# Patient Record
Sex: Female | Born: 1950 | Race: White | Hispanic: No | Marital: Married
Health system: Southern US, Community
[De-identification: ages and names within clinical notes are randomized; demographics above are authoritative.]

---

## 2016-05-16 ENCOUNTER — Encounter: Payer: Self-pay | Admitting: Gastroenterology

## 2016-06-14 ENCOUNTER — Ambulatory Visit: Payer: Self-pay | Admitting: Nurse Practitioner

## 2016-10-12 ENCOUNTER — Other Ambulatory Visit (HOSPITAL_COMMUNITY): Payer: Medicare Other

## 2016-10-12 ENCOUNTER — Inpatient Hospital Stay
Admission: AD | Admit: 2016-10-12 | Discharge: 2016-11-20 | Disposition: A | Discharge status: Home / Self Care | Payer: Medicare Other | Source: Ambulatory Visit | Attending: Internal Medicine | Admitting: Internal Medicine

## 2016-10-12 DIAGNOSIS — K7460 Unspecified cirrhosis of liver: Secondary | ICD-10-CM

## 2016-10-12 DIAGNOSIS — Z992 Dependence on renal dialysis: Secondary | ICD-10-CM

## 2016-10-12 DIAGNOSIS — J9690 Respiratory failure, unspecified, unspecified whether with hypoxia or hypercapnia: Secondary | ICD-10-CM

## 2016-10-12 DIAGNOSIS — Z539 Procedure and treatment not carried out, unspecified reason: Secondary | ICD-10-CM

## 2016-10-12 DIAGNOSIS — I770 Arteriovenous fistula, acquired: Secondary | ICD-10-CM

## 2016-10-12 LAB — HEMOGLOBIN A1C
HEMOGLOBIN A1C: 5.1 % (ref 4.8–5.6)
MEAN PLASMA GLUCOSE: 99.67 mg/dL

## 2016-10-12 LAB — RENAL FUNCTION PANEL
ALBUMIN: 3.9 g/dL (ref 3.5–5.0)
ANION GAP: 6 (ref 5–15)
BUN: 43 mg/dL — AB (ref 6–20)
CALCIUM: 9.4 mg/dL (ref 8.9–10.3)
CO2: 42 mmol/L — ABNORMAL HIGH (ref 22–32)
CREATININE: 1.6 mg/dL — AB (ref 0.44–1.00)
Chloride: 90 mmol/L — ABNORMAL LOW (ref 101–111)
GFR calc Af Amer: 37 mL/min — ABNORMAL LOW (ref >60)
GFR calc non Af Amer: 32 mL/min — ABNORMAL LOW (ref >60)
GLUCOSE: 119 mg/dL — AB (ref 65–99)
PHOSPHORUS: 2.8 mg/dL (ref 2.5–4.6)
Potassium: 3.6 mmol/L (ref 3.5–5.1)
SODIUM: 138 mmol/L (ref 135–145)

## 2016-10-12 LAB — BLOOD GAS, ARTERIAL
ACID-BASE EXCESS: 15 mmol/L — AB (ref 0.0–2.0)
Bicarbonate: 41.1 mmol/L — ABNORMAL HIGH (ref 20.0–28.0)
O2 Content: 3 L/min
O2 Saturation: 91.4 %
PATIENT TEMPERATURE: 98.6
PH ART: 7.36 (ref 7.350–7.450)
pCO2 arterial: 74.6 mmHg (ref 32.0–48.0)
pO2, Arterial: 61.4 mmHg — ABNORMAL LOW (ref 83.0–108.0)

## 2016-10-12 LAB — COMPREHENSIVE METABOLIC PANEL
ALBUMIN: 3.9 g/dL (ref 3.5–5.0)
ALT: 18 U/L (ref 14–54)
ANION GAP: 7 (ref 5–15)
AST: 32 U/L (ref 15–41)
Alkaline Phosphatase: 157 U/L — ABNORMAL HIGH (ref 38–126)
BUN: 44 mg/dL — ABNORMAL HIGH (ref 6–20)
CALCIUM: 9.6 mg/dL (ref 8.9–10.3)
CHLORIDE: 89 mmol/L — AB (ref 101–111)
CO2: 41 mmol/L — ABNORMAL HIGH (ref 22–32)
CREATININE: 1.57 mg/dL — AB (ref 0.44–1.00)
GFR calc Af Amer: 38 mL/min — ABNORMAL LOW (ref >60)
GFR calc non Af Amer: 33 mL/min — ABNORMAL LOW (ref >60)
GLUCOSE: 121 mg/dL — AB (ref 65–99)
POTASSIUM: 3.6 mmol/L (ref 3.5–5.1)
SODIUM: 137 mmol/L (ref 135–145)
TOTAL PROTEIN: 6.4 g/dL — AB (ref 6.5–8.1)
Total Bilirubin: 4.3 mg/dL — ABNORMAL HIGH (ref 0.3–1.2)

## 2016-10-12 LAB — MAGNESIUM: Magnesium: 2 mg/dL (ref 1.7–2.4)

## 2016-10-12 LAB — CBC WITH DIFFERENTIAL/PLATELET
BASOS ABS: 0.1 10*3/uL (ref 0.0–0.1)
BASOS PCT: 1 %
EOS ABS: 1 10*3/uL — AB (ref 0.0–0.7)
EOS PCT: 13 %
HCT: 30 % — ABNORMAL LOW (ref 36.0–46.0)
Hemoglobin: 9.1 g/dL — ABNORMAL LOW (ref 12.0–15.0)
Lymphocytes Relative: 18 %
Lymphs Abs: 1.4 10*3/uL (ref 0.7–4.0)
MCH: 30.6 pg (ref 26.0–34.0)
MCHC: 30.3 g/dL (ref 30.0–36.0)
MCV: 101 fL — ABNORMAL HIGH (ref 78.0–100.0)
MONO ABS: 0.7 10*3/uL (ref 0.1–1.0)
MONOS PCT: 9 %
NEUTROS ABS: 4.5 10*3/uL (ref 1.7–7.7)
Neutrophils Relative %: 59 %
PLATELETS: 52 10*3/uL — AB (ref 150–400)
RBC: 2.97 MIL/uL — ABNORMAL LOW (ref 3.87–5.11)
RDW: 16.7 % — AB (ref 11.5–15.5)
WBC: 7.6 10*3/uL (ref 4.0–10.5)

## 2016-10-12 LAB — PHOSPHORUS: Phosphorus: 2.8 mg/dL (ref 2.5–4.6)

## 2016-10-12 LAB — TSH: TSH: 19.349 u[IU]/mL — ABNORMAL HIGH (ref 0.350–4.500)

## 2016-10-13 LAB — VANCOMYCIN, TROUGH: VANCOMYCIN TR: 19 ug/mL (ref 15–20)

## 2016-10-13 LAB — PROTIME-INR
INR: 1.42
Prothrombin Time: 17.2 seconds — ABNORMAL HIGH (ref 11.4–15.2)

## 2016-10-13 LAB — CBC
HEMATOCRIT: 31 % — AB (ref 36.0–46.0)
Hemoglobin: 9.2 g/dL — ABNORMAL LOW (ref 12.0–15.0)
MCH: 30.6 pg (ref 26.0–34.0)
MCHC: 29.7 g/dL — AB (ref 30.0–36.0)
MCV: 103 fL — AB (ref 78.0–100.0)
Platelets: 50 10*3/uL — ABNORMAL LOW (ref 150–400)
RBC: 3.01 MIL/uL — ABNORMAL LOW (ref 3.87–5.11)
RDW: 17.3 % — ABNORMAL HIGH (ref 11.5–15.5)
WBC: 6.9 10*3/uL (ref 4.0–10.5)

## 2016-10-13 LAB — RENAL FUNCTION PANEL
ALBUMIN: 3.5 g/dL (ref 3.5–5.0)
Anion gap: 6 (ref 5–15)
BUN: 46 mg/dL — ABNORMAL HIGH (ref 6–20)
CO2: 42 mmol/L — ABNORMAL HIGH (ref 22–32)
Calcium: 9.4 mg/dL (ref 8.9–10.3)
Chloride: 92 mmol/L — ABNORMAL LOW (ref 101–111)
Creatinine, Ser: 1.68 mg/dL — ABNORMAL HIGH (ref 0.44–1.00)
GFR calc Af Amer: 35 mL/min — ABNORMAL LOW (ref >60)
GFR calc non Af Amer: 30 mL/min — ABNORMAL LOW (ref >60)
Glucose, Bld: 140 mg/dL — ABNORMAL HIGH (ref 65–99)
PHOSPHORUS: 3.6 mg/dL (ref 2.5–4.6)
POTASSIUM: 3.6 mmol/L (ref 3.5–5.1)
Sodium: 140 mmol/L (ref 135–145)

## 2016-10-13 NOTE — Consult Note (Signed)
[facility] CONSULT NOTE    Date: 10/13/2016                  Patient Name:  Jane Boyer  MRN: [id number]  DOB: March 12, 1950  Age / Sex: 66 y.o., female         PCP: Legrand Pitts, PA                 Service Requesting Consult: Hospitalist                 Reason for Consult: Acute renal failure/CKD stage III/refractory volume overload            History of Present Illness: Patient is a 66 y.o. female with a PMHx of Left lower extremity cellulitis, cirrhosis of the liver secondary to fatty liver disease, generalized edema, acute renal failure, chronic kidney disease stage III, super morbid obesity, who was admitted to [facility] on 10/12/2016 for ongoing treatment of lower extremity edema, refractory edema, and acute on chronic kidney disease. Patient reports thatshe was started on ultrafiltration at the outside hospital. I also had a chance to discuss the case with the nephrologist at the outside hospital. He did confirm that patient had ultrafiltration of approximately 30 pounds. She was initially started onLasix drip which did not work. She was then transitioned to a Bumex drip which did not work very well either. She has tolerated ultrafiltration quite well.  Her BUN is currently 43 with a creatinine of 1.6. She also She continues to undergo antibiotic therapy.   Medications: Current medications: Vitamin D 5000 units daily, Zosyn 3.375 g IV every 12 hours, vancomycin 1500 mg IV as per pharmacy, Pepcid 20 mg daily, Protonix 40 mg twice a day, potassium effervescent 20 mEq daily, ferrous sulfate 325 mg twice a day, fluticasone salmeterol 1 inhalation twice a day, floor store 250 mg twice a day, ropinirole 0.25 mg daily, metoprolol 25 mg twice a day, folic acid 1 mg daily, Neurontin 600 mg daily, oxycodone 5 mgevery 6 hours when necessary, Tylenol 650 mg every 6 hours    Allergies: No known drug allergies   Past Medical History: Generalized  edema Respiratory acidosis Anemia of chronic kidney disease Cirrhosis of the liver secondary to fatty liver disease Acute on chronic diastolic heart failure Chronic kidne obesity Restls leg syndrome Obesity hypoventilation syndrome Rheumatoid arthritis   Past Surgical History: dialysis catheteracement  Family History: No family history of end-stage renal disease.  Social History: From [place], [place].  No tobacco, ETOH, or illicit drug use.   Review of Systems: Review of Systems  Constitutional: Positive for weight loss. Negative for chills and fever.  HENT: Negative for ear discharge, hearing loss and nosebleeds.   Eyes: Negative for blurred vision and double vision.  Respiratory: Positive for cough and shortness of breath.   Cardiovascular: Positive for orthopnea and leg swelling. Negative for chest pain and palpitations.  Gastrointestinal: Negative for heartburn, nausea and vomiting.  Genitourinary: Negative for dysuria, frequency and urgency.  Musculoskeletal: Positive for back pain. Negative for myalgias.  Skin: Positive for rash.  Neurological: Positive for weakness. Negative for dizziness and focal weakness.  Endo/Heme/Allergies: Negative for polydipsia. Does not bruise/bleed easily.  Psychiatric/Behavioral: Negative for depression. The patient is nervous/anxious.      Vital Signs: There were no vitals taken for this visit.  Weight trends: There were no vitals filed for this visit.  Physical Exam: General: Morbidly obese female, no acute distress  Head: Normocephalic, atraumatic.  Eyes: Anicteric, EOMI  Nose: Mucous membranes moist, not inflammed, nonerythematous.  Throat: Oropharynx nonerythematous, no exudate appreciated.   Neck: Supple, trachea midline.  Lungs:  Normal respiratory effort. Clear to auscultation BL without crackles or wheezes.  Heart: RRR. S1 and S2 normal without gallop, murmur, or rubs.  Abdomen:  BS normoactive. Soft, Nondistended,  non-tender.  No masses or organomegaly.  Extremities: Bilateral upper and lower extremity edema, cellulitis LLE  Neurologic: A&O X3, Motor strength is 5/5 in the all 4 extremities  Skin: Cellulitis apparent in LLE    Lab results: Basic Metabolic Panel:  Recent Labs Lab 10/12/16 0854  NA 137  138  K 3.6  3.6  CL 89*  90*  CO2 41*  42*  GLUCOSE 121*  119*  BUN 44*  43*  CREATININE 1.57*  1.60*  CALCIUM 9.6  9.4  MG 2.0  PHOS 2.8  2.8    Liver Function Tests:  Recent Labs Lab 10/12/16 0854  AST 32  ALT 18  ALKPHOS 157*  BILITOT 4.3*  PROT 6.4*  ALBUMIN 3.9  3.9   No results for input(s): LIPASE, AMYLASE in the last 168 hours. No results for input(s): AMMONIA in the last 168 hours.  CBC:  Recent Labs Lab 10/12/16 0854  WBC 7.6  NEUTROABS 4.5  HGB 9.1*  HCT 30.0*  MCV 101.0*  PLT 52*    Cardiac Enzymes: No results for input(s): CKTOTAL, CKMB, CKMBINDEX, TROPONINI in the last 168 hours.  BNP: Invalid input(s): POCBNP  CBG: No results for input(s): GLUCAP in the last 168 hours.  Microbiology: No results found for this or any previous visit.  Coagulation Studies:  Recent Labs  10/13/16 0550  LABPROT 17.2*  INR 1.42    Urinalysis: No results for input(s): COLORURINE, LABSPEC, PHURINE, GLUCOSEU, HGBUR, BILIRUBINUR, KETONESUR, PROTEINUR, UROBILINOGEN, NITRITE, LEUKOCYTESUR in the last 72 hours.  Invalid input(s): APPERANCEUR    Imaging: Dg Chest Port 1 View  Result Date: 10/12/2016 CLINICAL DATA:  Respiratory failure EXAM: PORTABLE CHEST 1 VIEW COMPARISON:  None. FINDINGS: There is a PICC on the right with tip at the SVC. Dialysis catheter on the right with tip at the upper cavoatrial junction. Cardiomegaly. Diffuse interstitial opacity which could reflect edema. There is asymmetric hazy opacity and pleural based thickening on the right at the base. No pneumothorax. IMPRESSION: 1. Cardiomegaly and edema/vascular congestion. 2. Patchy  lung opacity and asymmetric right pleural based thickening, pneumonia could be superimposed. Electronically Signed   By: Marnee Spring M.D.   On: 10/12/2016 10:40      Assessment & Plan: Pt is a 66 y.o. female with a PMHx of Left lower extremity cellulitis, cirrhosis of the liver secondary to fatty liver disease, generalized edema, acute renal failure, chronic kidney disease stage III, super morbid obesity, obesity hypoventilation syndrome, rheumatoid  Arthritis, restless leg syndrome who [facility] of generalized edema and lower extremity cellulitis.  1. Acute renal failure/chronic kidney disease stage III. Previously the patient's renal function had deteriorated a bit. However she appto be closer to her baseline now. I discussed the case with the patient's nephrologist at the outside hospital. He was performing sequential ultrafiltration without dialysis which was working well. We ill continue this plan. Continue to monitor renal function.  2. Generalized edema. Patient continues to have significant edema. Approximately 30 pounds of fluid were removed with sequential ultrafiltration. Set the patient up for sequential ultrafiltration today as well as on Monday.  3.  Anemia of chronic kidney disease. HLOBIN CURRENTLY (>!> Hold off on Aranesp for now.  4.Thanks for consultation.

## 2016-10-14 ENCOUNTER — Other Ambulatory Visit (HOSPITAL_COMMUNITY): Payer: Medicare Other

## 2016-10-14 LAB — HEPATITIS B CORE ANTIBODY, TOTAL: HEP B C TOTAL AB: NEGATIVE

## 2016-10-14 LAB — HEPATITIS B SURFACE ANTIBODY, QUANTITATIVE: Hepatitis B-Post: 8.6 m[IU]/mL — ABNORMAL LOW (ref >9.9)

## 2016-10-14 LAB — HEPATITIS B SURFACE ANTIGEN: HEP B S AG: NEGATIVE

## 2016-10-16 LAB — BLOOD GAS, ARTERIAL
ACID-BASE EXCESS: 9 mmol/L — AB (ref 0.0–2.0)
ACID-BASE EXCESS: 9.2 mmol/L — AB (ref 0.0–2.0)
ACID-BASE EXCESS: 8.4 mmol/L — AB (ref 0.0–2.0)
Acid-Base Excess: 6.3 mmol/L — ABNORMAL HIGH (ref 0.0–2.0)
Acid-Base Excess: 9.4 mmol/L — ABNORMAL HIGH (ref 0.0–2.0)
BICARBONATE: 35.3 mmol/L — AB (ref 20.0–28.0)
BICARBONATE: 36.4 mmol/L — AB (ref 20.0–28.0)
BICARBONATE: 36.7 mmol/L — AB (ref 20.0–28.0)
Bicarbonate: 36.6 mmol/L — ABNORMAL HIGH (ref 20.0–28.0)
Bicarbonate: 32.7 mmol/L — ABNORMAL HIGH (ref 20.0–28.0)
DELIVERY SYSTEMS: POSITIVE
DELIVERY SYSTEMS: POSITIVE
Delivery systems: POSITIVE
EXPIRATORY PAP: 8
EXPIRATORY PAP: 6
Expiratory PAP: 8
FIO2: 30
FIO2: 35
FIO2: 30
Inspiratory PAP: 16
Inspiratory PAP: 18
Inspiratory PAP: 22
O2 CONTENT: 1 L/min
O2 CONTENT: 2 L/min
O2 SAT: 92.1 %
O2 SAT: 93 %
O2 SAT: 97.1 %
O2 Saturation: 86.1 %
O2 Saturation: 98.3 %
PATIENT TEMPERATURE: 97.2
PATIENT TEMPERATURE: 97.6
PATIENT TEMPERATURE: 98.6
PATIENT TEMPERATURE: 98.6
PCO2 ART: 80.3 mmHg — AB (ref 32.0–48.0)
PCO2 ART: 81 mmHg — AB (ref 32.0–48.0)
PCO2 ART: 88.8 mmHg — AB (ref 32.0–48.0)
PH ART: 7.233 — AB (ref 7.350–7.450)
PH ART: 7.262 — AB (ref 7.350–7.450)
PO2 ART: 62.7 mmHg — AB (ref 83.0–108.0)
Patient temperature: 97.2
pCO2 arterial: 89.1 mmHg (ref 32.0–48.0)
pCO2 arterial: 70.8 mmHg (ref 32.0–48.0)
pH, Arterial: 7.235 — ABNORMAL LOW (ref 7.350–7.450)
pH, Arterial: 7.273 — ABNORMAL LOW (ref 7.350–7.450)
pH, Arterial: 7.286 — ABNORMAL LOW (ref 7.350–7.450)
pO2, Arterial: 52.7 mmHg — ABNORMAL LOW (ref 83.0–108.0)
pO2, Arterial: 112 mmHg — ABNORMAL HIGH (ref 83.0–108.0)
pO2, Arterial: 67.9 mmHg — ABNORMAL LOW (ref 83.0–108.0)
pO2, Arterial: 91.1 mmHg (ref 83.0–108.0)

## 2016-10-16 LAB — DIC (DISSEMINATED INTRAVASCULAR COAGULATION) PANEL
APTT: 33 s (ref 24–36)
FIBRINOGEN: 232 mg/dL (ref 210–475)
PLATELETS: 37 10*3/uL — AB (ref 150–400)
SMEAR REVIEW: NONE SEEN

## 2016-10-16 LAB — CBC
HCT: 29.8 % — ABNORMAL LOW (ref 36.0–46.0)
HEMATOCRIT: 29.5 % — AB (ref 36.0–46.0)
HEMOGLOBIN: 8.7 g/dL — AB (ref 12.0–15.0)
HEMOGLOBIN: 8.8 g/dL — AB (ref 12.0–15.0)
MCH: 31 pg (ref 26.0–34.0)
MCH: 30.8 pg (ref 26.0–34.0)
MCHC: 29.5 g/dL — AB (ref 30.0–36.0)
MCHC: 29.5 g/dL — AB (ref 30.0–36.0)
MCV: 105 fL — AB (ref 78.0–100.0)
MCV: 104.2 fL — ABNORMAL HIGH (ref 78.0–100.0)
Platelets: 36 10*3/uL — ABNORMAL LOW (ref 150–400)
Platelets: 40 10*3/uL — ABNORMAL LOW (ref 150–400)
RBC: 2.81 MIL/uL — AB (ref 3.87–5.11)
RBC: 2.86 MIL/uL — ABNORMAL LOW (ref 3.87–5.11)
RDW: 18.4 % — ABNORMAL HIGH (ref 11.5–15.5)
RDW: 18.4 % — AB (ref 11.5–15.5)
WBC: 7 10*3/uL (ref 4.0–10.5)
WBC: 6.5 10*3/uL (ref 4.0–10.5)

## 2016-10-16 LAB — RENAL FUNCTION PANEL
ALBUMIN: 3.3 g/dL — AB (ref 3.5–5.0)
ANION GAP: 10 (ref 5–15)
ANION GAP: 12 (ref 5–15)
Albumin: 3.4 g/dL — ABNORMAL LOW (ref 3.5–5.0)
BUN: 65 mg/dL — AB (ref 6–20)
BUN: 68 mg/dL — AB (ref 6–20)
CALCIUM: 9.4 mg/dL (ref 8.9–10.3)
CHLORIDE: 92 mmol/L — AB (ref 101–111)
CO2: 37 mmol/L — ABNORMAL HIGH (ref 22–32)
CO2: 35 mmol/L — AB (ref 22–32)
Calcium: 9.4 mg/dL (ref 8.9–10.3)
Chloride: 92 mmol/L — ABNORMAL LOW (ref 101–111)
Creatinine, Ser: 3.16 mg/dL — ABNORMAL HIGH (ref 0.44–1.00)
Creatinine, Ser: 3.42 mg/dL — ABNORMAL HIGH (ref 0.44–1.00)
GFR calc Af Amer: 16 mL/min — ABNORMAL LOW (ref >60)
GFR calc Af Amer: 15 mL/min — ABNORMAL LOW (ref >60)
GFR calc non Af Amer: 13 mL/min — ABNORMAL LOW (ref >60)
GFR, EST NON AFRICAN AMERICAN: 14 mL/min — AB (ref >60)
GLUCOSE: 73 mg/dL (ref 65–99)
Glucose, Bld: 70 mg/dL (ref 65–99)
PHOSPHORUS: 6 mg/dL — AB (ref 2.5–4.6)
POTASSIUM: 4.7 mmol/L (ref 3.5–5.1)
POTASSIUM: 4.8 mmol/L (ref 3.5–5.1)
Phosphorus: 6 mg/dL — ABNORMAL HIGH (ref 2.5–4.6)
SODIUM: 139 mmol/L (ref 135–145)
Sodium: 139 mmol/L (ref 135–145)

## 2016-10-16 LAB — VANCOMYCIN, TROUGH: Vancomycin Tr: 26 ug/mL (ref 15–20)

## 2016-10-16 LAB — DIC (DISSEMINATED INTRAVASCULAR COAGULATION)PANEL
D-Dimer, Quant: 4.86 ug/mL-FEU — ABNORMAL HIGH (ref 0.00–0.50)
INR: 1.43
Prothrombin Time: 17.3 seconds — ABNORMAL HIGH (ref 11.4–15.2)

## 2016-10-16 LAB — MAGNESIUM: MAGNESIUM: 2.3 mg/dL (ref 1.7–2.4)

## 2016-10-16 NOTE — Progress Notes (Signed)
[facility]  ROUNDING NOTE   Subjective:  Patient appears critically ill at the moment. She is currently on BiPAP. Her ABG shows a pH of 7.2, PCO2 81, PO2 60 7.9. In addition her BUN and creatinine also worsening.  Objective:  Vital signs in last 24 hours:  Temperature 90.7 pulse 1 1 respirations 18 blood pressure 106/68  Physical Exam: General: Critically ill appearing, on bipap  Head: bipap facemask on  Eyes: Anicteric  Neck: Supple, trachea midline  Lungs:  Scattered rhonchi, increased work of breathing  Heart: S1S2 no rubs  Abdomen:  Soft, nontender, bowel sounds present  Extremities: 2+ peripheral edema.  Neurologic: Arousable,will follow simple commands  Skin: No lesions       Basic Metabolic Panel:  Recent Labs Lab 10/12/16 0854 10/13/16 1018 10/16/16 0647  NA 137  138 140 139  K 3.6  3.6 3.6 4.7  CL 89*  90* 92* 92*  CO2 41*  42* 42* 37*  GLUCOSE 121*  119* 140* 70  BUN 44*  43* 46* 65*  CREATININE 1.57*  1.60* 1.68* 3.16*  CALCIUM 9.6  9.4 9.4 9.4  MG 2.0  --  2.3  PHOS 2.8  2.8 3.6 6.0*    Liver Function Tests:  Recent Labs Lab 10/12/16 0854 10/13/16 1018 10/16/16 0647  AST 32  --   --   ALT 18  --   --   ALKPHOS 157*  --   --   BILITOT 4.3*  --   --   PROT 6.4*  --   --   ALBUMIN 3.9  3.9 3.5 3.3*   No results for input(s): LIPASE, AMYLASE in the last 168 hours. No results for input(s): AMMONIA in the last 168 hours.  CBC:  Recent Labs Lab 10/12/16 0854 10/13/16 1018 10/16/16 0647  WBC 7.6 6.9 7.0  NEUTROABS 4.5  --   --   HGB 9.1* 9.2* 8.7*  HCT 30.0* 31.0* 29.5*  MCV 101.0* 103.0* 105.0*  PLT 52* 50* 36*    Cardiac Enzymes: No results for input(s): CKTOTAL, CKMB, CKMBINDEX, TROPONINI in the last 168 hours.  BNP: Invalid input(s): POCBNP  CBG: No results for input(s): GLUCAP in the last 168 hours.  Microbiology: No results found for this or any previous visit.  Coagulation Studies: No  results for input(s): LABPROT, INR in the last 72 hours.  Urinalysis: No results for input(s): COLORURINE, LABSPEC, PHURINE, GLUCOSEU, HGBUR, BILIRUBINUR, KETONESUR, PROTEINUR, UROBILINOGEN, NITRITE, LEUKOCYTESUR in the last 72 hours.  Invalid input(s): APPERANCEUR    Imaging: No results found.   Medications:       Assessment/ Plan:  66 y.o. female with a PMHx of Left lower extremity cellulitis, cirrhosis of the liver secondary to fatty liver disease, generalized edema, acute renal failure, chronic kidney disease stage III, super morbid obesity, obesity hypoventilation syndrome, rheumatoid  Arthritis, restless leg syndrome who [facility] treatment of generalized edema and lower extremity cellulitis.  1. Acute renal failure/chronic kidney disease stage III. Patient was receiving sequential ultrafiltration at outside hospital. - We will function appears to be deteriorating at the moment. Therefore we will switch her from sequential ultrafiltration to dialysis. Ultrafiltration target will be 2.5 to 3 kg as tolerated today.  2. Generalized edema. Previously had 30 pounds of fluid removed with sequential ultrafiltration. -  Blood pressure a bit lower today. Therefore we will set up a little ultrafiltration target today.  3.  Anemia of chronic kidney disease.  Hemoglobin down to 8.7. Continue to monitor.  4. Acute respiratory failure. Patient currently on BiPAP. ABG shows pH of 7.2 with a PCO2 of 81.  Management as per hospitalist and pulmonary/critical care.   LOS: 0 Brytney Somes 10/8/201811:55 AM

## 2016-10-17 LAB — CBC
HEMATOCRIT: 29.3 % — AB (ref 36.0–46.0)
Hemoglobin: 9 g/dL — ABNORMAL LOW (ref 12.0–15.0)
MCH: 31.3 pg (ref 26.0–34.0)
MCHC: 30.7 g/dL (ref 30.0–36.0)
MCV: 101.7 fL — AB (ref 78.0–100.0)
PLATELETS: 36 10*3/uL — AB (ref 150–400)
RBC: 2.88 MIL/uL — AB (ref 3.87–5.11)
RDW: 18.6 % — AB (ref 11.5–15.5)
WBC: 5 10*3/uL (ref 4.0–10.5)

## 2016-10-18 LAB — CBC
HCT: 28.7 % — ABNORMAL LOW (ref 36.0–46.0)
HCT: 29.6 % — ABNORMAL LOW (ref 36.0–46.0)
HEMOGLOBIN: 8.8 g/dL — AB (ref 12.0–15.0)
Hemoglobin: 9 g/dL — ABNORMAL LOW (ref 12.0–15.0)
MCH: 31 pg (ref 26.0–34.0)
MCH: 30.8 pg (ref 26.0–34.0)
MCHC: 30.7 g/dL (ref 30.0–36.0)
MCHC: 30.4 g/dL (ref 30.0–36.0)
MCV: 101.1 fL — ABNORMAL HIGH (ref 78.0–100.0)
MCV: 101.4 fL — AB (ref 78.0–100.0)
PLATELETS: 41 10*3/uL — AB (ref 150–400)
PLATELETS: 49 10*3/uL — AB (ref 150–400)
RBC: 2.84 MIL/uL — ABNORMAL LOW (ref 3.87–5.11)
RBC: 2.92 MIL/uL — ABNORMAL LOW (ref 3.87–5.11)
RDW: 18.9 % — AB (ref 11.5–15.5)
RDW: 19 % — AB (ref 11.5–15.5)
WBC: 6.1 10*3/uL (ref 4.0–10.5)
WBC: 7.8 10*3/uL (ref 4.0–10.5)

## 2016-10-18 LAB — RENAL FUNCTION PANEL
ALBUMIN: 3.4 g/dL — AB (ref 3.5–5.0)
ANION GAP: 14 (ref 5–15)
Albumin: 3.4 g/dL — ABNORMAL LOW (ref 3.5–5.0)
Anion gap: 11 (ref 5–15)
BUN: 57 mg/dL — AB (ref 6–20)
BUN: 34 mg/dL — ABNORMAL HIGH (ref 6–20)
CALCIUM: 9.2 mg/dL (ref 8.9–10.3)
CALCIUM: 8.9 mg/dL (ref 8.9–10.3)
CHLORIDE: 97 mmol/L — AB (ref 101–111)
CO2: 28 mmol/L (ref 22–32)
CO2: 27 mmol/L (ref 22–32)
CREATININE: 3.43 mg/dL — AB (ref 0.44–1.00)
CREATININE: 2.52 mg/dL — AB (ref 0.44–1.00)
Chloride: 94 mmol/L — ABNORMAL LOW (ref 101–111)
GFR calc Af Amer: 15 mL/min — ABNORMAL LOW (ref >60)
GFR calc Af Amer: 22 mL/min — ABNORMAL LOW (ref >60)
GFR calc non Af Amer: 13 mL/min — ABNORMAL LOW (ref >60)
GFR calc non Af Amer: 19 mL/min — ABNORMAL LOW (ref >60)
GLUCOSE: 199 mg/dL — AB (ref 65–99)
GLUCOSE: 150 mg/dL — AB (ref 65–99)
PHOSPHORUS: 4.9 mg/dL — AB (ref 2.5–4.6)
Phosphorus: 3.7 mg/dL (ref 2.5–4.6)
Potassium: 4.4 mmol/L (ref 3.5–5.1)
Potassium: 3.8 mmol/L (ref 3.5–5.1)
SODIUM: 136 mmol/L (ref 135–145)
SODIUM: 135 mmol/L (ref 135–145)

## 2016-10-18 LAB — VANCOMYCIN, TROUGH: Vancomycin Tr: 20 ug/mL (ref 15–20)

## 2016-10-18 LAB — HEPARIN INDUCED PLATELET AB (HIT ANTIBODY): HEPARIN INDUCED PLT AB: 0.41 {OD_unit} — AB (ref 0.000–0.400)

## 2016-10-18 NOTE — Progress Notes (Signed)
[facility]  ROUNDING NOTE   Subjective:  Patient much more awake and alert this a.m. Back on nasal cannula at the moment. She did complete hemodialysis earlier today.  Objective:  Vital signs in last 24 hours:  Temperature 97.0 pulse 70 respirations 20 blood pressure 125/62  Physical Exam: General: No acute distress  Head: [place]/AT hearing intact  Eyes: Anicteric  Neck: Supple, trachea midline  Lungs:  Scattered rhonchi, normal effort  Heart: S1S2 no rubs  Abdomen:  Soft, nontender, bowel sounds present  Extremities: 2+ peripheral edema.  Neurologic: Awake, alert, following commands  Skin: No lesions       Basic Metabolic Panel:  Recent Labs Lab 10/12/16 0854 10/13/16 1018 10/16/16 0647 10/16/16 1238 10/18/16 0500  NA 137  138 140 139 139 136  K 3.6  3.6 3.6 4.7 4.8 4.4  CL 89*  90* 92* 92* 92* 94*  CO2 41*  42* 42* 37* 35* 28  GLUCOSE 121*  119* 140* 70 73 199*  BUN 44*  43* 46* 65* 68* 57*  CREATININE 1.57*  1.60* 1.68* 3.16* 3.42* 3.43*  CALCIUM 9.6  9.4 9.4 9.4 9.4 9.2  MG 2.0  --  2.3  --   --   PHOS 2.8  2.8 3.6 6.0* 6.0* 4.9*    Liver Function Tests:  Recent Labs Lab 10/12/16 0854 10/13/16 1018 10/16/16 0647 10/16/16 1238 10/18/16 0500  AST 32  --   --   --   --   ALT 18  --   --   --   --   ALKPHOS 157*  --   --   --   --   BILITOT 4.3*  --   --   --   --   PROT 6.4*  --   --   --   --   ALBUMIN 3.9  3.9 3.5 3.3* 3.4* 3.4*   No results for input(s): LIPASE, AMYLASE in the last 168 hours. No results for input(s): AMMONIA in the last 168 hours.  CBC:  Recent Labs Lab 10/12/16 0854 10/13/16 1018 10/16/16 0647 10/16/16 1238 10/16/16 1505 10/17/16 0606 10/18/16 0500  WBC 7.6 6.9 7.0 6.5  --  5.0 6.1  NEUTROABS 4.5  --   --   --   --   --   --   HGB 9.1* 9.2* 8.7* 8.8*  --  9.0* 8.8*  HCT 30.0* 31.0* 29.5* 29.8*  --  29.3* 28.7*  MCV 101.0* 103.0* 105.0* 104.2*  --  101.7* 101.1*  PLT 52* 50* 36* 40* 37* 36* 41*     Cardiac Enzymes: No results for input(s): CKTOTAL, CKMB, CKMBINDEX, TROPONINI in the last 168 hours.  BNP: Invalid input(s): POCBNP  CBG: No results for input(s): GLUCAP in the last 168 hours.  Microbiology: No results found for this or any previous visit.  Coagulation Studies:  Recent Labs  10/16/16 1505  LABPROT 17.3*  INR 1.43    Urinalysis: No results for input(s): COLORURINE, LABSPEC, PHURINE, GLUCOSEU, HGBUR, BILIRUBINUR, KETONESUR, PROTEINUR, UROBILINOGEN, NITRITE, LEUKOCYTESUR in the last 72 hours.  Invalid input(s): APPERANCEUR    Imaging: No results found.   Medications:       Assessment/ Plan:  66 y.o. female with a PMHx of Left lower extremity cellulitis, cirrhosis of the liver secondary to fatty liver disease, generalized edema, acute renal failure, chronic kidney disease stage III, super morbid obesity, obesity hypoventilation syndrome, rheumatoid  Arthritis, restless leg syndrome who [facility] treatment of generalized edema and lower extremity cellulitis.  1. Acute renal failure/chronic kidney disease stage III. Patient was receiving sequential ultrafiltration at outside hospital. - patient switched from ultrafiltration to hemodialysis. She appears to be tolerating well. She had a treatment today. We will plan for treatment again on Friday.  2. Generalized edema. Previously had 30 pounds of fluid removed with sequential ultrafiltration. -  We will continue to plan for ultrafiltration target of 3-4 kg per treatment.  3.  Anemia of chronic kidney disease. Hemoglobin currently 8.8. We will continue to monitor.  4. Acute respiratory failure. Mental status has significantly improved. She was on BiPAP at our last visit. She is now on nasal cannula.   LOS: 0 Omarie Parcell 10/10/20183:52 PM

## 2016-10-19 LAB — BASIC METABOLIC PANEL
Anion gap: 9 (ref 5–15)
BUN: 42 mg/dL — ABNORMAL HIGH (ref 6–20)
CO2: 31 mmol/L (ref 22–32)
Calcium: 9.1 mg/dL (ref 8.9–10.3)
Chloride: 96 mmol/L — ABNORMAL LOW (ref 101–111)
Creatinine, Ser: 2.93 mg/dL — ABNORMAL HIGH (ref 0.44–1.00)
GFR calc Af Amer: 18 mL/min — ABNORMAL LOW (ref >60)
GFR calc non Af Amer: 16 mL/min — ABNORMAL LOW (ref >60)
Glucose, Bld: 136 mg/dL — ABNORMAL HIGH (ref 65–99)
Potassium: 3.7 mmol/L (ref 3.5–5.1)
Sodium: 136 mmol/L (ref 135–145)

## 2016-10-19 LAB — MAGNESIUM: Magnesium: 2.1 mg/dL (ref 1.7–2.4)

## 2016-10-20 LAB — VANCOMYCIN, TROUGH: Vancomycin Tr: 21 ug/mL (ref 15–20)

## 2016-10-20 NOTE — Progress Notes (Signed)
  [facility]  ROUNDING NOTE   Subjective:  Patient completed hemodialysis today. Ultrafiltration achieved was 3.5 kg.  Objective:  Vital signs in last 24 hours:  Temperature 97 pulse 99 respirations 20 blood pressure 104/67  Physical Exam: General: No acute distress  Head: [place]/AT hearing intact  Eyes: Anicteric  Neck: Supple, trachea midline  Lungs:  Scattered rhonchi, normal effort  Heart: S1S2 no rubs  Abdomen:  Soft, nontender, bowel sounds present  Extremities: 2+ peripheral edema.  Neurologic: Awake, alert, following commands  Skin: Blood blisters LLE       Basic Metabolic Panel:  Recent Labs Lab 10/16/16 0647 10/16/16 1238 10/18/16 0500 10/18/16 1921 10/19/16 0618  NA 139 139 136 135 136  K 4.7 4.8 4.4 3.8 3.7  CL 92* 92* 94* 97* 96*  CO2 37* 35* GLUCOSE 70 73 199* 150* 136*  BUN 65* 68* 57* 34* 42*  CREATININE 3.16* 3.42* 3.43* 2.52* 2.93*  CALCIUM 9.4 9.4 9.2 8.9 9.1  MG 2.3  --   --   --  2.1  PHOS 6.0* 6.0* 4.9* 3.7  --     Liver Function Tests:  Recent Labs Lab 10/16/16 0647 10/16/16 1238 10/18/16 0500 10/18/16 1921  ALBUMIN 3.3* 3.4* 3.4* 3.4*   No results for input(s): LIPASE, AMYLASE in the last 168 hours. No results for input(s): AMMONIA in the last 168 hours.  CBC:  Recent Labs Lab 10/16/16 0647 10/16/16 1238 10/16/16 1505 10/17/16 0606 10/18/16 0500 10/18/16 1921  WBC 7.0 6.5  --  5.0 6.1 7.8  HGB 8.7* 8.8*  --  9.0* 8.8* 9.0*  HCT 29.5* 29.8*  --  29.3* 28.7* 29.6*  MCV 105.0* 104.2*  --  101.7* 101.1* 101.4*  PLT 36* 40* 37* 36* 41* 49*    Cardiac Enzymes: No results for input(s): CKTOTAL, CKMB, CKMBINDEX, TROPONINI in the last 168 hours.  BNP: Invalid input(s): POCBNP  CBG: No results for input(s): GLUCAP in the last 168 hours.  Microbiology: No results found for this or any previous visit.  Coagulation Studies: No results for input(s): LABPROT, INR in the last 72  hours.  Urinalysis: No results for input(s): COLORURINE, LABSPEC, PHURINE, GLUCOSEU, HGBUR, BILIRUBINUR, KETONESUR, PROTEINUR, UROBILINOGEN, NITRITE, LEUKOCYTESUR in the last 72 hours.  Invalid input(s): APPERANCEUR    Imaging: No results found.   Medications:       Assessment/ Plan:  66 y.o. female with a PMHx of Left lower extremity cellulitis, cirrhosis of the liver secondary to fatty liver disease, generalized edema, acute renal failure, chronic kidney disease stage III, super morbid obesity, obesity hypoventilation syndrome, rheumatoid  Arthritis, restless leg syndrome who [facility] treatment of generalized edema and lower extremity cellulitis.  1. Acute renal failure/chronic kidney disease stage III. Patient was receiving sequential ultrafiltration at outside hospital. - earlier this week we switch the patient from ultrafiltration to hemodialysis. She has tolerated this better. We will plan for dialysis again on Monday.  2. Generalized edema. Previously had 30 pounds of fluid removed with sequential ultrafiltration. -  Ultrafiltration achieved today was 3.5 kg. We will plan for continued ultrafiltration with dialysis on Monday.  3.  Anemia of chronic kidney disease. Hemoglobin up to 9.0. We will continue to monitor CBC.  4. Acute respiratory failure. Much improved with respiratory support and ultrafiltration. No longer requiring BiPAP.   LOS: 0 Ohm Dentler 10/12/20186:33 PM

## 2016-10-21 LAB — CBC
HEMATOCRIT: 31.3 % — AB (ref 36.0–46.0)
Hemoglobin: 9.4 g/dL — ABNORMAL LOW (ref 12.0–15.0)
MCH: 30.5 pg (ref 26.0–34.0)
MCHC: 30 g/dL (ref 30.0–36.0)
MCV: 101.6 fL — ABNORMAL HIGH (ref 78.0–100.0)
Platelets: 53 10*3/uL — ABNORMAL LOW (ref 150–400)
RBC: 3.08 MIL/uL — ABNORMAL LOW (ref 3.87–5.11)
RDW: 18.4 % — ABNORMAL HIGH (ref 11.5–15.5)
WBC: 8.9 10*3/uL (ref 4.0–10.5)

## 2016-10-23 LAB — RENAL FUNCTION PANEL
Albumin: 3.1 g/dL — ABNORMAL LOW (ref 3.5–5.0)
Anion gap: 14 (ref 5–15)
BUN: 73 mg/dL — AB (ref 6–20)
CHLORIDE: 94 mmol/L — AB (ref 101–111)
CO2: 24 mmol/L (ref 22–32)
CREATININE: 4.93 mg/dL — AB (ref 0.44–1.00)
Calcium: 8.9 mg/dL (ref 8.9–10.3)
GFR calc non Af Amer: 8 mL/min — ABNORMAL LOW (ref >60)
GFR, EST AFRICAN AMERICAN: 10 mL/min — AB (ref >60)
Glucose, Bld: 169 mg/dL — ABNORMAL HIGH (ref 65–99)
POTASSIUM: 5.2 mmol/L — AB (ref 3.5–5.1)
Phosphorus: 7.1 mg/dL — ABNORMAL HIGH (ref 2.5–4.6)
Sodium: 132 mmol/L — ABNORMAL LOW (ref 135–145)

## 2016-10-23 LAB — CBC
HCT: 30.8 % — ABNORMAL LOW (ref 36.0–46.0)
Hemoglobin: 9.8 g/dL — ABNORMAL LOW (ref 12.0–15.0)
MCH: 32 pg (ref 26.0–34.0)
MCHC: 31.8 g/dL (ref 30.0–36.0)
MCV: 100.7 fL — AB (ref 78.0–100.0)
PLATELETS: 54 10*3/uL — AB (ref 150–400)
RBC: 3.06 MIL/uL — ABNORMAL LOW (ref 3.87–5.11)
RDW: 18.2 % — AB (ref 11.5–15.5)
WBC: 7.8 10*3/uL (ref 4.0–10.5)

## 2016-10-23 NOTE — Progress Notes (Signed)
[facility]  ROUNDING NOTE   Subjective:  Patient had dialysis performed earlier in the day. Ultrafiltration achieved was 3 kg. Patient appears a bit more confused today.  Objective:  Vital signs in last 24 hours:  Temperature 97.7 pulse 80 respirations 18 blood pressure 127/59  Physical Exam: General: No acute distress  Head: [place]/AT hearing intact  Eyes: Anicteric  Neck: Supple, trachea midline  Lungs:  Scattered rhonchi, normal effort  Heart: S1S2 no rubs  Abdomen:  Soft, nontender, bowel sounds present  Extremities: 2+ peripheral edema, b/l LE cellulitis noted  Neurologic: Awake, alert, following commands, confused  Skin: Blood blisters LLE       Basic Metabolic Panel:  Recent Labs Lab 10/18/16 0500 10/18/16 1921 10/19/16 0618 10/23/16 0534  NA 136 135 136 132*  K 4.4 3.8 3.7 5.2*  CL 94* 97* 96* 94*  CO2 GLUCOSE 199* 150* 136* 169*  BUN 57* 34* 42* 73*  CREATININE 3.43* 2.52* 2.93* 4.93*  CALCIUM 9.2 8.9 9.1 8.9  MG  --   --  2.1  --   PHOS 4.9* 3.7  --  7.1*    Liver Function Tests:  Recent Labs Lab 10/18/16 0500 10/18/16 1921 10/23/16 0534  ALBUMIN 3.4* 3.4* 3.1*   No results for input(s): LIPASE, AMYLASE in the last 168 hours. No results for input(s): AMMONIA in the last 168 hours.  CBC:  Recent Labs Lab 10/17/16 0606 10/18/16 0500 10/18/16 1921 10/21/16 0716 10/23/16 0534  WBC 5.0 6.1 7.8 8.9 7.8  HGB 9.0* 8.8* 9.0* 9.4* 9.8*  HCT 29.3* 28.7* 29.6* 31.3* 30.8*  MCV 101.7* 101.1* 101.4* 101.6* 100.7*  PLT 36* 41* 49* 53* 54*    Cardiac Enzymes: No results for input(s): CKTOTAL, CKMB, CKMBINDEX, TROPONINI in the last 168 hours.  BNP: Invalid input(s): POCBNP  CBG: No results for input(s): GLUCAP in the last 168 hours.  Microbiology: No results found for this or any previous visit.  Coagulation Studies: No results for input(s): LABPROT, INR in the last 72 hours.  Urinalysis: No results for  input(s): COLORURINE, LABSPEC, PHURINE, GLUCOSEU, HGBUR, BILIRUBINUR, KETONESUR, PROTEINUR, UROBILINOGEN, NITRITE, LEUKOCYTESUR in the last 72 hours.  Invalid input(s): APPERANCEUR    Imaging: No results found.   Medications:       Assessment/ Plan:  66 y.o. female with a PMHx of Left lower extremity cellulitis, cirrhosis of the liver secondary to fatty liver disease, generalized edema, acute renal failure, chronic kidney disease stage III, super morbid obesity, obesity hypoventilation syndrome, rheumatoid  Arthritis, restless leg syndrome who [facility] treatment of generalized edema and lower extremity cellulitis.  1. Acute renal failure/chronic kidney disease stage III. Patient was receiving sequential ultrafiltration at outside hospital. - We will maintain the patient on hemodialysis. She underwent hemodialysis today and ultrafiltration achieved was 3 kg. We will plan for ultrafiltration and dialysis again on Wednesday.  2. Generalized edema. Previously had 30 pounds of fluid removed with sequential ultrafiltration. -  Ultrafiltration achieved today was 3 kg. Plan for additional ultrafiltration on Wednesday.  3.  Anemia of chronic kidney disease.  Hemoglobin now up to 9.8. Continue to monitor. Patient also has thrombus cytopenia we will continue to pack catheter with citrate.  4. Acute respiratory failure. Patient seems a bit more confused today. Suspect that her CO2 retention is worse at the moment. This was discussed with nursing. Patient has declined BiPAP at times.  LOS: 0 Jaymes Revels 10/15/20184:02  PM

## 2016-10-25 LAB — RENAL FUNCTION PANEL
ANION GAP: 12 (ref 5–15)
Albumin: 3.1 g/dL — ABNORMAL LOW (ref 3.5–5.0)
BUN: 71 mg/dL — AB (ref 6–20)
CHLORIDE: 96 mmol/L — AB (ref 101–111)
CO2: 22 mmol/L (ref 22–32)
Calcium: 8.7 mg/dL — ABNORMAL LOW (ref 8.9–10.3)
Creatinine, Ser: 4.77 mg/dL — ABNORMAL HIGH (ref 0.44–1.00)
GFR calc Af Amer: 10 mL/min — ABNORMAL LOW (ref >60)
GFR calc non Af Amer: 9 mL/min — ABNORMAL LOW (ref >60)
GLUCOSE: 112 mg/dL — AB (ref 65–99)
POTASSIUM: 5.6 mmol/L — AB (ref 3.5–5.1)
Phosphorus: 6.6 mg/dL — ABNORMAL HIGH (ref 2.5–4.6)
Sodium: 130 mmol/L — ABNORMAL LOW (ref 135–145)

## 2016-10-25 LAB — CBC
HEMATOCRIT: 30.6 % — AB (ref 36.0–46.0)
HEMOGLOBIN: 9.6 g/dL — AB (ref 12.0–15.0)
MCH: 31.6 pg (ref 26.0–34.0)
MCHC: 31.4 g/dL (ref 30.0–36.0)
MCV: 100.7 fL — ABNORMAL HIGH (ref 78.0–100.0)
Platelets: 67 10*3/uL — ABNORMAL LOW (ref 150–400)
RBC: 3.04 MIL/uL — ABNORMAL LOW (ref 3.87–5.11)
RDW: 18.4 % — AB (ref 11.5–15.5)
WBC: 8.4 10*3/uL (ref 4.0–10.5)

## 2016-10-25 NOTE — Progress Notes (Signed)
  [facility]  ROUNDING NOTE   Subjective:  Patient completed hemodialysis today. Ultrafiltration achieved was 3.2 kg.   Objective:  Vital signs in last 24 hours:  Temperature 97.4 pulse 80 respirations 16 blood pressure 118/73  Physical Exam: General: No acute distress  Head: [place]/AT hearing intact  Eyes: Anicteric  Neck: Supple, trachea midline  Lungs:  Scattered rhonchi, normal effort  Heart: S1S2 no rubs  Abdomen:  Soft, nontender, bowel sounds present  Extremities: 2+ peripheral edema, b/l LE cellulitis noted  Neurologic: Awake, alert, following commands, confused  Skin: B/L LE cellulitis       Basic Metabolic Panel:  Recent Labs Lab 10/18/16 1921 10/19/16 0618 10/23/16 0534 10/25/16 0619  NA 135 136 132* 130*  K 3.8 3.7 5.2* 5.6*  CL 97* 96* 94* 96*  CO2 27 31 24 22   GLUCOSE 150* 136* 169* 112*  BUN 34* 42* 73* 71*  CREATININE 2.52* 2.93* 4.93* 4.77*  CALCIUM 8.9 9.1 8.9 8.7*  MG  --  2.1  --   --   PHOS 3.7  --  7.1* 6.6*    Liver Function Tests:  Recent Labs Lab 10/18/16 1921 10/23/16 0534 10/25/16 0619  ALBUMIN 3.4* 3.1* 3.1*   No results for input(s): LIPASE, AMYLASE in the last 168 hours. No results for input(s): AMMONIA in the last 168 hours.  CBC:  Recent Labs Lab 10/18/16 1921 10/21/16 0716 10/23/16 0534 10/25/16 0619  WBC 7.8 8.9 7.8 8.4  HGB 9.0* 9.4* 9.8* 9.6*  HCT 29.6* 31.3* 30.8* 30.6*  MCV 101.4* 101.6* 100.7* 100.7*  PLT 49* 53* 54* 67*    Cardiac Enzymes: No results for input(s): CKTOTAL, CKMB, CKMBINDEX, TROPONINI in the last 168 hours.  BNP: Invalid input(s): POCBNP  CBG: No results for input(s): GLUCAP in the last 168 hours.  Microbiology: No results found for this or any previous visit.  Coagulation Studies: No results for input(s): LABPROT, INR in the last 72 hours.  Urinalysis: No results for input(s): COLORURINE, LABSPEC, PHURINE, GLUCOSEU, HGBUR, BILIRUBINUR, KETONESUR, PROTEINUR,  UROBILINOGEN, NITRITE, LEUKOCYTESUR in the last 72 hours.  Invalid input(s): APPERANCEUR    Imaging: No results found.   Medications:       Assessment/ Plan:  66 y.o. female with a PMHx of Left lower extremity cellulitis, cirrhosis of the liver secondary to fatty liver disease, generalized edema, acute renal failure, chronic kidney disease stage III, super morbid obesity, obesity hypoventilation syndrome, rheumatoid  Arthritis, restless leg syndrome who was admitted to select specialty for ongoing treatment of generalized edema and lower extremity cellulitis.  1. Acute renal failure/chronic kidney disease stage III. Patient was receiving sequential ultrafiltration at outside hospital. - Despite hemodialysis on Monday BUN and creatinine still remained high and potassium is also still slightly high. If this trend continues to remain need to replace her PermCath. Reevaluate BMP tomorrow.  2. Generalized edema. Previously had 30 pounds of fluid removed with sequential ultrafiltration. -  Ultrafiltration achieved was 3.2 kg today. We will continue ultrafiltration with dialysis.  3.  Anemia of chronic kidney disease.  Hemoglobin relatively stable at 9.6. Continue to monitor CBC. Hold off on Epogen.  4. Acute respiratory failure. Patient has had times where she declined BiPAP. Encouraged patient to wear BiPAP is recommended.  5. Hyperkalemia. Serum potassium was 5.6 today. We will need to remeasure this tomorrow. Discussed with hospitalist.   LOS: 0 Erik Nessel 10/17/20183:36 PM

## 2016-10-26 LAB — MAGNESIUM: Magnesium: 2.1 mg/dL (ref 1.7–2.4)

## 2016-10-26 LAB — RENAL FUNCTION PANEL
Albumin: 2.9 g/dL — ABNORMAL LOW (ref 3.5–5.0)
Anion gap: 8 (ref 5–15)
BUN: 54 mg/dL — ABNORMAL HIGH (ref 6–20)
CALCIUM: 8.3 mg/dL — AB (ref 8.9–10.3)
CO2: 27 mmol/L (ref 22–32)
Chloride: 95 mmol/L — ABNORMAL LOW (ref 101–111)
Creatinine, Ser: 4 mg/dL — ABNORMAL HIGH (ref 0.44–1.00)
GFR calc non Af Amer: 11 mL/min — ABNORMAL LOW (ref >60)
GFR, EST AFRICAN AMERICAN: 12 mL/min — AB (ref >60)
Glucose, Bld: 126 mg/dL — ABNORMAL HIGH (ref 65–99)
Phosphorus: 5.4 mg/dL — ABNORMAL HIGH (ref 2.5–4.6)
Potassium: 4 mmol/L (ref 3.5–5.1)
SODIUM: 130 mmol/L — AB (ref 135–145)

## 2016-10-27 LAB — RENAL FUNCTION PANEL
Albumin: 2.9 g/dL — ABNORMAL LOW (ref 3.5–5.0)
Anion gap: 11 (ref 5–15)
BUN: 68 mg/dL — AB (ref 6–20)
CHLORIDE: 97 mmol/L — AB (ref 101–111)
CO2: 22 mmol/L (ref 22–32)
Calcium: 8.4 mg/dL — ABNORMAL LOW (ref 8.9–10.3)
Creatinine, Ser: 4.78 mg/dL — ABNORMAL HIGH (ref 0.44–1.00)
GFR calc Af Amer: 10 mL/min — ABNORMAL LOW (ref >60)
GFR calc non Af Amer: 9 mL/min — ABNORMAL LOW (ref >60)
GLUCOSE: 80 mg/dL (ref 65–99)
POTASSIUM: 5 mmol/L (ref 3.5–5.1)
Phosphorus: 6.3 mg/dL — ABNORMAL HIGH (ref 2.5–4.6)
Sodium: 130 mmol/L — ABNORMAL LOW (ref 135–145)

## 2016-10-27 LAB — CBC
HCT: 28.7 % — ABNORMAL LOW (ref 36.0–46.0)
HEMOGLOBIN: 9.3 g/dL — AB (ref 12.0–15.0)
MCH: 32 pg (ref 26.0–34.0)
MCHC: 32.4 g/dL (ref 30.0–36.0)
MCV: 98.6 fL (ref 78.0–100.0)
Platelets: 11 10*3/uL — CL (ref 150–400)
RBC: 2.91 MIL/uL — AB (ref 3.87–5.11)
RDW: 17.9 % — AB (ref 11.5–15.5)
WBC: 6.2 10*3/uL (ref 4.0–10.5)

## 2016-10-27 LAB — TYPE AND SCREEN
ABO/RH(D): O POS
Antibody Screen: NEGATIVE

## 2016-10-27 LAB — ABO/RH: ABO/RH(D): O POS

## 2016-10-27 NOTE — Progress Notes (Signed)
  [facility]  ROUNDING NOTE   Subjective:  Patient due for hemodialysis again today. Still has lower extremity edema with cellulitis.   Objective:  Vital signs in last 24 hours:  Temperature 97.0 pulse 70 respirations 20 blood pressure 122/61  Physical Exam: General: No acute distress  Head: [place]/AT hearing intact  Eyes: Anicteric  Neck: Supple, trachea midline  Lungs:  Scattered rhonchi, normal effort  Heart: S1S2 no rubs  Abdomen:  Soft, nontender, bowel sounds present  Extremities: 2+ peripheral edema, b/l LE cellulitis noted  Neurologic: Awake, alert, following commands, confused  Skin: B/L LE cellulitis  Access:  Right internal jugular PermCath     Basic Metabolic Panel:  Recent Labs Lab 10/23/16 0534 10/25/16 0619 10/26/16 0658  NA 132* 130* 130*  K 5.2* 5.6* 4.0  CL 94* 96* 95*  CO2 24 22 27   GLUCOSE 169* 112* 126*  BUN 73* 71* 54*  CREATININE 4.93* 4.77* 4.00*  CALCIUM 8.9 8.7* 8.3*  MG  --   --  2.1  PHOS 7.1* 6.6* 5.4*    Liver Function Tests:  Recent Labs Lab 10/23/16 0534 10/25/16 0619 10/26/16 0658  ALBUMIN 3.1* 3.1* 2.9*   No results for input(s): LIPASE, AMYLASE in the last 168 hours. No results for input(s): AMMONIA in the last 168 hours.  CBC:  Recent Labs Lab 10/21/16 0716 10/23/16 0534 10/25/16 0619  WBC 8.9 7.8 8.4  HGB 9.4* 9.8* 9.6*  HCT 31.3* 30.8* 30.6*  MCV 101.6* 100.7* 100.7*  PLT 53* 54* 67*    Cardiac Enzymes: No results for input(s): CKTOTAL, CKMB, CKMBINDEX, TROPONINI in the last 168 hours.  BNP: Invalid input(s): POCBNP  CBG: No results for input(s): GLUCAP in the last 168 hours.  Microbiology: No results found for this or any previous visit.  Coagulation Studies: No results for input(s): LABPROT, INR in the last 72 hours.  Urinalysis: No results for input(s): COLORURINE, LABSPEC, PHURINE, GLUCOSEU, HGBUR, BILIRUBINUR, KETONESUR, PROTEINUR, UROBILINOGEN, NITRITE, LEUKOCYTESUR in the last  72 hours.  Invalid input(s): APPERANCEUR    Imaging: No results found.   Medications:       Assessment/ Plan:  66 y.o. female with a PMHx of Left lower extremity cellulitis, cirrhosis of the liver secondary to fatty liver disease, generalized edema, acute renal failure, chronic kidney disease stage III, super morbid obesity, obesity hypoventilation syndrome, rheumatoid  Arthritis, restless leg syndrome who was admitted to select specialty for ongoing treatment of generalized edema and lower extremity cellulitis.  1. Acute renal failure/chronic kidney disease stage III. Patient was receiving sequential ultrafiltration at outside hospital. - Patient due for hemodialysis today. Orders have been prepared.  2. Generalized edema. Previously had 30 pounds of fluid removed with sequential ultrafiltration. -  We will continue ultrafiltration target of 3-3.5 kg with dialysis.  3.  Anemia of chronic kidney disease.  Hemoglobin 9.6 at last check. Continue peritoneal monitor CBC. We will also continue to pack the patient's catheter with citrate.  4. Acute respiratory failure. Patient is to wear BiPAP usually at night. There've been periods of nonadherence.  5. Hyperkalemia. Potassium down to 4.0 and now acceptable. Continue to periodically monitor serum potassium.   LOS: 0 Avonne Berkery 10/19/20188:22 AM

## 2016-10-28 LAB — CBC
HEMATOCRIT: 28.3 % — AB (ref 36.0–46.0)
HEMOGLOBIN: 9 g/dL — AB (ref 12.0–15.0)
MCH: 31.3 pg (ref 26.0–34.0)
MCHC: 31.8 g/dL (ref 30.0–36.0)
MCV: 98.3 fL (ref 78.0–100.0)
Platelets: 50 10*3/uL — ABNORMAL LOW (ref 150–400)
RBC: 2.88 MIL/uL — ABNORMAL LOW (ref 3.87–5.11)
RDW: 18.2 % — AB (ref 11.5–15.5)
WBC: 7.1 10*3/uL (ref 4.0–10.5)

## 2016-10-28 LAB — PREPARE PLATELET PHERESIS: Unit division: 0

## 2016-10-28 LAB — BPAM PLATELET PHERESIS
Blood Product Expiration Date: 201810202359
ISSUE DATE / TIME: 201810191312
Unit Type and Rh: 6200

## 2016-10-30 LAB — CBC
HCT: 28.7 % — ABNORMAL LOW (ref 36.0–46.0)
HEMOGLOBIN: 9.3 g/dL — AB (ref 12.0–15.0)
MCH: 31.3 pg (ref 26.0–34.0)
MCHC: 32.4 g/dL (ref 30.0–36.0)
MCV: 96.6 fL (ref 78.0–100.0)
Platelets: 45 10*3/uL — ABNORMAL LOW (ref 150–400)
RBC: 2.97 MIL/uL — AB (ref 3.87–5.11)
RDW: 17.1 % — ABNORMAL HIGH (ref 11.5–15.5)
WBC: 6.6 10*3/uL (ref 4.0–10.5)

## 2016-10-30 LAB — RENAL FUNCTION PANEL
ANION GAP: 11 (ref 5–15)
Albumin: 3.2 g/dL — ABNORMAL LOW (ref 3.5–5.0)
BUN: 46 mg/dL — ABNORMAL HIGH (ref 6–20)
CALCIUM: 8.4 mg/dL — AB (ref 8.9–10.3)
CO2: 24 mmol/L (ref 22–32)
CREATININE: 3.72 mg/dL — AB (ref 0.44–1.00)
Chloride: 95 mmol/L — ABNORMAL LOW (ref 101–111)
GFR, EST AFRICAN AMERICAN: 13 mL/min — AB (ref >60)
GFR, EST NON AFRICAN AMERICAN: 12 mL/min — AB (ref >60)
Glucose, Bld: 131 mg/dL — ABNORMAL HIGH (ref 65–99)
PHOSPHORUS: 4.3 mg/dL (ref 2.5–4.6)
Potassium: 3.9 mmol/L (ref 3.5–5.1)
SODIUM: 130 mmol/L — AB (ref 135–145)

## 2016-10-31 LAB — BLOOD GAS, ARTERIAL
Acid-base deficit: 2 mmol/L (ref 0.0–2.0)
BICARBONATE: 23.9 mmol/L (ref 20.0–28.0)
O2 Content: 1 L/min
O2 Saturation: 98.7 %
PATIENT TEMPERATURE: 98.6
PCO2 ART: 53.5 mmHg — AB (ref 32.0–48.0)
PO2 ART: 122 mmHg — AB (ref 83.0–108.0)
pH, Arterial: 7.272 — ABNORMAL LOW (ref 7.350–7.450)

## 2016-10-31 NOTE — Progress Notes (Signed)
  [facility]  ROUNDING NOTE   Subjective:  Patient to be lethargic today but is arousable. She continues dialysis on a Monday, Wednesday, Friday schedule. She continues to tolerate ultrafiltration quite well.   Objective:  Vital signs in last 24 hours:  Temperature 97.4 pulse 80 respirations 20. Blood pressure 1:15/51  Physical Exam: General: No acute distress  Head: [place]/AT hearing intact  Eyes: Anicteric  Neck: Supple, trachea midline  Lungs:  Scattered rhonchi, normal effort  Heart: S1S2 no rubs  Abdomen:  Soft, nontender, bowel sounds present  Extremities: Bilateral lower extremities wrapped  Neurologic: Lethargic but arousable, follows simple commands  Skin: No rashes noted, b/l LE's wrapped  Access:  Right internal jugular PermCath     Basic Metabolic Panel:  Recent Labs Lab 10/25/16 0619 10/26/16 0658 10/27/16 0902 10/30/16 0500  NA 130* 130* 130* 130*  K 5.6* 4.0 5.0 3.9  CL 96* 95* 97* 95*  CO2 22 27 22 24   GLUCOSE 112* 126* 80 131*  BUN 71* 54* 68* 46*  CREATININE 4.77* 4.00* 4.78* 3.72*  CALCIUM 8.7* 8.3* 8.4* 8.4*  MG  --  2.1  --   --   PHOS 6.6* 5.4* 6.3* 4.3    Liver Function Tests:  Recent Labs Lab 10/25/16 0619 10/26/16 0658 10/27/16 0902 10/30/16 0500  ALBUMIN 3.1* 2.9* 2.9* 3.2*   No results for input(s): LIPASE, AMYLASE in the last 168 hours. No results for input(s): AMMONIA in the last 168 hours.  CBC:  Recent Labs Lab 10/25/16 0619 10/27/16 0902 10/28/16 0531 10/30/16 0500  WBC 8.4 6.2 7.1 6.6  HGB 9.6* 9.3* 9.0* 9.3*  HCT 30.6* 28.7* 28.3* 28.7*  MCV 100.7* 98.6 98.3 96.6  PLT 67* 11* 50* 45*    Cardiac Enzymes: No results for input(s): CKTOTAL, CKMB, CKMBINDEX, TROPONINI in the last 168 hours.  BNP: Invalid input(s): POCBNP  CBG: No results for input(s): GLUCAP in the last 168 hours.  Microbiology: No results found for this or any previous visit.  Coagulation Studies: No results for input(s):  LABPROT, INR in the last 72 hours.  Urinalysis: No results for input(s): COLORURINE, LABSPEC, PHURINE, GLUCOSEU, HGBUR, BILIRUBINUR, KETONESUR, PROTEINUR, UROBILINOGEN, NITRITE, LEUKOCYTESUR in the last 72 hours.  Invalid input(s): APPERANCEUR    Imaging: No results found.   Medications:       Assessment/ Plan:  66 y.o. female with a PMHx of Left lower extremity cellulitis, cirrhosis of the liver secondary to fatty liver disease, generalized edema, acute renal failure, chronic kidney disease stage III, super morbid obesity, obesity hypoventilation syndrome, rheumatoid  Arthritis, restless leg syndrome who was admitted to select specialty for ongoing treatment of generalized edema and lower extremity cellulitis.  1. Acute renal failure/chronic kidney disease stage III. Patient was receiving sequential ultrafiltration at outside hospital. - patient completed hemodialysis yesterday. No acute indication for dialysis today. We'll plan for dialysis again tomorrow.  2. Generalized edema. Previously had 30 pounds of fluid removed with sequential ultrafiltration. -  Improve with continued ultrafiltration. We will plan for an additional 3 kg of ultrafiltration tomorrow.  3.  Anemia of chronic kidney disease.  Hemoglobin currently 9.3. Continue to monitor.  4. Acute respiratory failure. atient continues to have daytime lethargy. Not fully treated with BiPAP.  5. Hyperkalemia. This has resolved now. Potassium down to 3.9. Recheck potassium tomorrow.  LOS: 0 Kaylin Schellenberg 10/23/20183:39 PM

## 2016-11-01 LAB — CBC
HCT: 28.9 % — ABNORMAL LOW (ref 36.0–46.0)
Hemoglobin: 9.4 g/dL — ABNORMAL LOW (ref 12.0–15.0)
MCH: 31.6 pg (ref 26.0–34.0)
MCHC: 32.5 g/dL (ref 30.0–36.0)
MCV: 97.3 fL (ref 78.0–100.0)
PLATELETS: 42 10*3/uL — AB (ref 150–400)
RBC: 2.97 MIL/uL — ABNORMAL LOW (ref 3.87–5.11)
RDW: 17.6 % — AB (ref 11.5–15.5)
WBC: 7.6 10*3/uL (ref 4.0–10.5)

## 2016-11-01 LAB — RENAL FUNCTION PANEL
ALBUMIN: 3.2 g/dL — AB (ref 3.5–5.0)
Anion gap: 13 (ref 5–15)
BUN: 70 mg/dL — AB (ref 6–20)
CHLORIDE: 93 mmol/L — AB (ref 101–111)
CO2: 23 mmol/L (ref 22–32)
CREATININE: 5.12 mg/dL — AB (ref 0.44–1.00)
Calcium: 9 mg/dL (ref 8.9–10.3)
GFR calc Af Amer: 9 mL/min — ABNORMAL LOW (ref >60)
GFR, EST NON AFRICAN AMERICAN: 8 mL/min — AB (ref >60)
GLUCOSE: 123 mg/dL — AB (ref 65–99)
POTASSIUM: 4.6 mmol/L (ref 3.5–5.1)
Phosphorus: 6.2 mg/dL — ABNORMAL HIGH (ref 2.5–4.6)
Sodium: 129 mmol/L — ABNORMAL LOW (ref 135–145)

## 2016-11-01 NOTE — Progress Notes (Signed)
  [facility]  ROUNDING NOTE   Subjective:  Patient completed hemodialysis today. She tolerated this well. Ultrafiltration achieved was 3 kg. Patient is more awake and alert today.  Objective:  Vital signs in last 24 hours:  Temperature 96.9 pulse 72 respirations 20 blood pressure 107/76  Physical Exam: General: No acute distress  Head: [place]/AT hearing intact  Eyes: Anicteric  Neck: Supple, trachea midline  Lungs:  Scattered rhonchi, normal effort  Heart: S1S2 no rubs  Abdomen:  Soft, nontender, bowel sounds present  Extremities: Bilateral lower extremities wrapped  Neurologic: Awake, alert, follows commands  Skin: No rashes noted, b/l LE's wrapped  Access:  Right internal jugular PermCath     Basic Metabolic Panel:  Recent Labs Lab 10/26/16 0658 10/27/16 0902 10/30/16 0500 11/01/16 0626  NA 130* 130* 130* 129*  K 4.0 5.0 3.9 4.6  CL 95* 97* 95* 93*  CO2 27 22 24 23   GLUCOSE 126* 80 131* 123*  BUN 54* 68* 46* 70*  CREATININE 4.00* 4.78* 3.72* 5.12*  CALCIUM 8.3* 8.4* 8.4* 9.0  MG 2.1  --   --   --   PHOS 5.4* 6.3* 4.3 6.2*    Liver Function Tests:  Recent Labs Lab 10/26/16 0658 10/27/16 0902 10/30/16 0500 11/01/16 0626  ALBUMIN 2.9* 2.9* 3.2* 3.2*   No results for input(s): LIPASE, AMYLASE in the last 168 hours. No results for input(s): AMMONIA in the last 168 hours.  CBC:  Recent Labs Lab 10/27/16 0902 10/28/16 0531 10/30/16 0500 11/01/16 0626  WBC 6.2 7.1 6.6 7.6  HGB 9.3* 9.0* 9.3* 9.4*  HCT 28.7* 28.3* 28.7* 28.9*  MCV 98.6 98.3 96.6 97.3  PLT 11* 50* 45* 42*    Cardiac Enzymes: No results for input(s): CKTOTAL, CKMB, CKMBINDEX, TROPONINI in the last 168 hours.  BNP: Invalid input(s): POCBNP  CBG: No results for input(s): GLUCAP in the last 168 hours.  Microbiology: No results found for this or any previous visit.  Coagulation Studies: No results for input(s): LABPROT, INR in the last 72 hours.  Urinalysis: No  results for input(s): COLORURINE, LABSPEC, PHURINE, GLUCOSEU, HGBUR, BILIRUBINUR, KETONESUR, PROTEINUR, UROBILINOGEN, NITRITE, LEUKOCYTESUR in the last 72 hours.  Invalid input(s): APPERANCEUR    Imaging: No results found.   Medications:       Assessment/ Plan:  66 y.o. female with a PMHx of Left lower extremity cellulitis, cirrhosis of the liver secondary to fatty liver disease, generalized edema, acute renal failure, chronic kidney disease stage III, super morbid obesity, obesity hypoventilation syndrome, rheumatoid  Arthritis, restless leg syndrome who was admitted to select specialty for ongoing treatment of generalized edema and lower extremity cellulitis.  1. Acute renal failure/chronic kidney disease stage III. Patient was receiving sequential ultrafiltration at outside hospital, which was continued here initially. Patient transitioned to IHD.  - patient completed hemodialysis today. We will per orders for Friday.  2. Generalized edema. Previously had 30 pounds of fluid removed with sequential ultrafiltration. -  Ultrafiltration achieved today was 3 kg. Continue ultrafiltration with dialysis.  3.  Anemia of chronic kidney disease.  Hemoglobin currently 9.4 and acceptable. Continue to monitor.  4. Acute respiratory failure. Continue nightly BiPAP as tolerated.  5. Hyperkalemia. Resolved. Serum potassium currently 4.6.   LOS: 0 Shirah Roseman 10/24/20183:13 PM

## 2016-11-02 ENCOUNTER — Encounter (HOSPITAL_BASED_OUTPATIENT_CLINIC_OR_DEPARTMENT_OTHER): Payer: Medicare Other

## 2016-11-02 DIAGNOSIS — Z992 Dependence on renal dialysis: Secondary | ICD-10-CM

## 2016-11-02 LAB — COMPREHENSIVE METABOLIC PANEL
ALBUMIN: 3.2 g/dL — AB (ref 3.5–5.0)
ALT: 54 U/L (ref 14–54)
AST: 39 U/L (ref 15–41)
Alkaline Phosphatase: 212 U/L — ABNORMAL HIGH (ref 38–126)
Anion gap: 11 (ref 5–15)
BUN: 48 mg/dL — AB (ref 6–20)
CHLORIDE: 95 mmol/L — AB (ref 101–111)
CO2: 24 mmol/L (ref 22–32)
CREATININE: 4.01 mg/dL — AB (ref 0.44–1.00)
Calcium: 8.7 mg/dL — ABNORMAL LOW (ref 8.9–10.3)
GFR calc Af Amer: 12 mL/min — ABNORMAL LOW (ref >60)
GFR, EST NON AFRICAN AMERICAN: 11 mL/min — AB (ref >60)
GLUCOSE: 100 mg/dL — AB (ref 65–99)
Potassium: 4 mmol/L (ref 3.5–5.1)
Sodium: 130 mmol/L — ABNORMAL LOW (ref 135–145)
Total Bilirubin: 2.1 mg/dL — ABNORMAL HIGH (ref 0.3–1.2)
Total Protein: 6.2 g/dL — ABNORMAL LOW (ref 6.5–8.1)

## 2016-11-02 LAB — CBC
HEMATOCRIT: 29.3 % — AB (ref 36.0–46.0)
Hemoglobin: 9.5 g/dL — ABNORMAL LOW (ref 12.0–15.0)
MCH: 31.8 pg (ref 26.0–34.0)
MCHC: 32.4 g/dL (ref 30.0–36.0)
MCV: 98 fL (ref 78.0–100.0)
PLATELETS: 42 10*3/uL — AB (ref 150–400)
RBC: 2.99 MIL/uL — AB (ref 3.87–5.11)
RDW: 17.7 % — ABNORMAL HIGH (ref 11.5–15.5)
WBC: 7.2 10*3/uL (ref 4.0–10.5)

## 2016-11-02 LAB — MAGNESIUM: MAGNESIUM: 2.1 mg/dL (ref 1.7–2.4)

## 2016-11-02 LAB — PHOSPHORUS: Phosphorus: 5.3 mg/dL — ABNORMAL HIGH (ref 2.5–4.6)

## 2016-11-02 NOTE — Progress Notes (Signed)
Bilateral upper extremity vein mapping has been completed. Preliminary results can be found in chart review -> CV Proc  11/02/16 2:30 PM Olen CordialGreg Vaishnavi Dalby RVT

## 2016-11-03 LAB — CBC
HEMATOCRIT: 29.6 % — AB (ref 36.0–46.0)
Hemoglobin: 9.6 g/dL — ABNORMAL LOW (ref 12.0–15.0)
MCH: 31.4 pg (ref 26.0–34.0)
MCHC: 32.4 g/dL (ref 30.0–36.0)
MCV: 96.7 fL (ref 78.0–100.0)
RBC: 3.06 MIL/uL — AB (ref 3.87–5.11)
RDW: 17 % — AB (ref 11.5–15.5)
WBC: 7.2 10*3/uL (ref 4.0–10.5)

## 2016-11-03 LAB — RENAL FUNCTION PANEL
ALBUMIN: 3.1 g/dL — AB (ref 3.5–5.0)
Anion gap: 10 (ref 5–15)
BUN: 63 mg/dL — ABNORMAL HIGH (ref 6–20)
CO2: 25 mmol/L (ref 22–32)
CREATININE: 4.93 mg/dL — AB (ref 0.44–1.00)
Calcium: 8.7 mg/dL — ABNORMAL LOW (ref 8.9–10.3)
Chloride: 92 mmol/L — ABNORMAL LOW (ref 101–111)
GFR calc Af Amer: 10 mL/min — ABNORMAL LOW (ref >60)
GFR, EST NON AFRICAN AMERICAN: 8 mL/min — AB (ref >60)
Glucose, Bld: 125 mg/dL — ABNORMAL HIGH (ref 65–99)
PHOSPHORUS: 6.5 mg/dL — AB (ref 2.5–4.6)
POTASSIUM: 4.8 mmol/L (ref 3.5–5.1)
Sodium: 127 mmol/L — ABNORMAL LOW (ref 135–145)

## 2016-11-03 NOTE — Progress Notes (Signed)
[facility]  ROUNDING NOTE   Subjective:  Patient seen and evaluated during hemodialysis today. Ultrafiltration target is to be kilograms. Patient sitting up in chair. Vein mapping was performed yesterday.  Objective:  Vital signs in last 24 hours:  Temperature 97.3 pulse 73 respirations 21 blood pressure 129/78  Physical Exam: General: No acute distress  Head: [place]/AT hearing intact  Eyes: Anicteric  Neck: Supple, trachea midline  Lungs:  Scattered rhonchi, normal effort  Heart: S1S2 no rubs  Abdomen:  Soft, nontender, bowel sounds present  Extremities: Bilateral lower extremities wrapped  Neurologic: Awake, alert, follows commands  Skin: No rashes noted, b/l LE's wrapped  Access:  Right internal jugular PermCath     Basic Metabolic Panel:  Recent Labs Lab 10/27/16 0902 10/30/16 0500 11/01/16 0626 11/02/16 0512 11/03/16 0730  NA 130* 130* 129* 130* 127*  K 5.0 3.9 4.6 4.0 4.8  CL 97* 95* 93* 95* 92*  CO2 22 24 23 24 25   GLUCOSE 80 131* 123* 100* 125*  BUN 68* 46* 70* 48* 63*  CREATININE 4.78* 3.72* 5.12* 4.01* 4.93*  CALCIUM 8.4* 8.4* 9.0 8.7* 8.7*  MG  --   --   --  2.1  --   PHOS 6.3* 4.3 6.2* 5.3* 6.5*    Liver Function Tests:  Recent Labs Lab 10/27/16 0902 10/30/16 0500 11/01/16 0626 11/02/16 0512 11/03/16 0730  AST  --   --   --  39  --   ALT  --   --   --  54  --   ALKPHOS  --   --   --  212*  --   BILITOT  --   --   --  2.1*  --   PROT  --   --   --  6.2*  --   ALBUMIN 2.9* 3.2* 3.2* 3.2* 3.1*   No results for input(s): LIPASE, AMYLASE in the last 168 hours. No results for input(s): AMMONIA in the last 168 hours.  CBC:  Recent Labs Lab 10/28/16 0531 10/30/16 0500 11/01/16 0626 11/02/16 0512 11/03/16 0730  WBC 7.1 6.6 7.6 7.2 7.2  HGB 9.0* 9.3* 9.4* 9.5* 9.6*  HCT 28.3* 28.7* 28.9* 29.3* 29.6*  MCV 98.3 96.6 97.3 98.0 96.7  PLT 50* 45* 42* 42* CONSISTENT WITH PREVIOUS RESULT    Cardiac Enzymes: No results for  input(s): CKTOTAL, CKMB, CKMBINDEX, TROPONINI in the last 168 hours.  BNP: Invalid input(s): POCBNP  CBG: No results for input(s): GLUCAP in the last 168 hours.  Microbiology: No results found for this or any previous visit.  Coagulation Studies: No results for input(s): LABPROT, INR in the last 72 hours.  Urinalysis: No results for input(s): COLORURINE, LABSPEC, PHURINE, GLUCOSEU, HGBUR, BILIRUBINUR, KETONESUR, PROTEINUR, UROBILINOGEN, NITRITE, LEUKOCYTESUR in the last 72 hours.  Invalid input(s): APPERANCEUR    Imaging: No results found.   Medications:       Assessment/ Plan:  66 y.o. female with a PMHx of Left lower extremity cellulitis, cirrhosis of the liver secondary to fatty liver disease, generalized edema, acute renal failure, chronic kidney disease stage III, super morbid obesity, obesity hypoventilation syndrome, rheumatoid  Arthritis, restless leg syndrome who was admitted to select specialty for ongoing treatment of generalized edema and lower extremity cellulitis.  1. Acute renal failure/chronic kidney disease stage III. Patient was receiving sequential ultrafiltration at outside hospital, which was continued here initially. Patient transitioned to IHD.  - Patient seen and evaluated during hemodialysis today. Ultra filtration target is  3 kg. We will plan for dialysis again on Monday.  2. Generalized edema. Previously had 30 pounds of fluid removed with sequential ultrafiltration. -  Significant progress made this admission. Continue ultrafiltration with dialysis.  3.  Anemia of chronic kidney disease.  Hemoglobin 9.6. Continue to monitor.  4. Acute respiratory failure. Recommend continuation of BiPAP at night.  5. Hyperkalemia. Resolved.   LOS: 0 Keysean Savino 10/26/20188:54 AM

## 2016-11-04 ENCOUNTER — Other Ambulatory Visit (HOSPITAL_COMMUNITY): Payer: Medicare Other

## 2016-11-04 LAB — CBC WITH DIFFERENTIAL/PLATELET
BASOS ABS: 0 10*3/uL (ref 0.0–0.1)
Basophils Relative: 0 %
EOS PCT: 2 %
Eosinophils Absolute: 0.2 10*3/uL (ref 0.0–0.7)
HEMATOCRIT: 29.8 % — AB (ref 36.0–46.0)
Hemoglobin: 9.7 g/dL — ABNORMAL LOW (ref 12.0–15.0)
LYMPHS ABS: 1.6 10*3/uL (ref 0.7–4.0)
LYMPHS PCT: 13 %
MCH: 31.6 pg (ref 26.0–34.0)
MCHC: 32.6 g/dL (ref 30.0–36.0)
MCV: 97.1 fL (ref 78.0–100.0)
MONO ABS: 0.7 10*3/uL (ref 0.1–1.0)
MONOS PCT: 6 %
NEUTROS ABS: 9.1 10*3/uL — AB (ref 1.7–7.7)
Neutrophils Relative %: 79 %
PLATELETS: 33 10*3/uL — AB (ref 150–400)
RBC: 3.07 MIL/uL — ABNORMAL LOW (ref 3.87–5.11)
RDW: 17.3 % — AB (ref 11.5–15.5)
WBC: 11.6 10*3/uL — ABNORMAL HIGH (ref 4.0–10.5)

## 2016-11-04 LAB — CBC
HCT: 28.5 % — ABNORMAL LOW (ref 36.0–46.0)
HEMOGLOBIN: 9.3 g/dL — AB (ref 12.0–15.0)
MCH: 31.8 pg (ref 26.0–34.0)
MCHC: 32.6 g/dL (ref 30.0–36.0)
MCV: 97.6 fL (ref 78.0–100.0)
PLATELETS: 30 10*3/uL — AB (ref 150–400)
RBC: 2.92 MIL/uL — AB (ref 3.87–5.11)
RDW: 17.3 % — ABNORMAL HIGH (ref 11.5–15.5)
WBC: 7.1 10*3/uL (ref 4.0–10.5)

## 2016-11-04 LAB — TYPE AND SCREEN
ABO/RH(D): O POS
Antibody Screen: NEGATIVE

## 2016-11-04 LAB — AMMONIA: Ammonia: 177 umol/L — ABNORMAL HIGH (ref 9–35)

## 2016-11-05 ENCOUNTER — Other Ambulatory Visit (HOSPITAL_COMMUNITY): Payer: Medicare Other

## 2016-11-05 LAB — CBC
HCT: 25.4 % — ABNORMAL LOW (ref 36.0–46.0)
Hemoglobin: 8.5 g/dL — ABNORMAL LOW (ref 12.0–15.0)
MCH: 32.2 pg (ref 26.0–34.0)
MCHC: 33.5 g/dL (ref 30.0–36.0)
MCV: 96.2 fL (ref 78.0–100.0)
PLATELETS: 52 10*3/uL — AB (ref 150–400)
RBC: 2.64 MIL/uL — AB (ref 3.87–5.11)
RDW: 17.5 % — ABNORMAL HIGH (ref 11.5–15.5)
WBC: 6.5 10*3/uL (ref 4.0–10.5)

## 2016-11-05 LAB — BPAM FFP
Blood Product Expiration Date: 201810312359
ISSUE DATE / TIME: 201810271146
Unit Type and Rh: 5100

## 2016-11-05 LAB — PREPARE PLATELET PHERESIS: Unit division: 0

## 2016-11-05 LAB — HEPARIN INDUCED PLATELET AB (HIT ANTIBODY): HEPARIN INDUCED PLT AB: 0.279 {OD_unit} (ref 0.000–0.400)

## 2016-11-05 LAB — PREPARE FRESH FROZEN PLASMA: Unit division: 0

## 2016-11-05 LAB — BPAM PLATELET PHERESIS
BLOOD PRODUCT EXPIRATION DATE: 201810292359
ISSUE DATE / TIME: 201810271348
Unit Type and Rh: 5100

## 2016-11-06 DIAGNOSIS — N186 End stage renal disease: Secondary | ICD-10-CM

## 2016-11-06 DIAGNOSIS — Z992 Dependence on renal dialysis: Secondary | ICD-10-CM

## 2016-11-06 LAB — RENAL FUNCTION PANEL
ANION GAP: 11 (ref 5–15)
Albumin: 3.4 g/dL — ABNORMAL LOW (ref 3.5–5.0)
BUN: 73 mg/dL — ABNORMAL HIGH (ref 6–20)
CALCIUM: 9.1 mg/dL (ref 8.9–10.3)
CO2: 24 mmol/L (ref 22–32)
CREATININE: 5.37 mg/dL — AB (ref 0.44–1.00)
Chloride: 94 mmol/L — ABNORMAL LOW (ref 101–111)
GFR, EST AFRICAN AMERICAN: 9 mL/min — AB (ref >60)
GFR, EST NON AFRICAN AMERICAN: 7 mL/min — AB (ref >60)
Glucose, Bld: 131 mg/dL — ABNORMAL HIGH (ref 65–99)
PHOSPHORUS: 6.8 mg/dL — AB (ref 2.5–4.6)
Potassium: 5 mmol/L (ref 3.5–5.1)
Sodium: 129 mmol/L — ABNORMAL LOW (ref 135–145)

## 2016-11-06 LAB — CBC
HCT: 29 % — ABNORMAL LOW (ref 36.0–46.0)
HEMOGLOBIN: 9.3 g/dL — AB (ref 12.0–15.0)
MCH: 31.6 pg (ref 26.0–34.0)
MCHC: 32.1 g/dL (ref 30.0–36.0)
MCV: 98.6 fL (ref 78.0–100.0)
PLATELETS: 69 10*3/uL — AB (ref 150–400)
RBC: 2.94 MIL/uL — AB (ref 3.87–5.11)
RDW: 17.4 % — ABNORMAL HIGH (ref 11.5–15.5)
WBC: 10.3 10*3/uL (ref 4.0–10.5)

## 2016-11-06 LAB — BPAM PLATELET PHERESIS
BLOOD PRODUCT EXPIRATION DATE: 201810292359
Blood Product Expiration Date: 201810292359
ISSUE DATE / TIME: 201810281622
ISSUE DATE / TIME: 201810281417
Unit Type and Rh: 600
Unit Type and Rh: 6200

## 2016-11-06 LAB — PREPARE PLATELET PHERESIS
UNIT DIVISION: 0
Unit division: 0

## 2016-11-06 LAB — AMMONIA: AMMONIA: 150 umol/L — AB (ref 9–35)

## 2016-11-06 NOTE — Progress Notes (Signed)
[facility]  ROUNDING NOTE   Subjective:  Patient seen during dialysis. Blood pressure quite low at 77/20. The pressure was repeated shortly thereafter and systolic blood pressure was in the 90s. Ultrafiltration has been stopped. Albumin has also been ordered.  Objective:  Vital signs in last 24 hours:  Pulse 90 respirations 17 blood pressure 77/20  Physical Exam: General: lethargic  Head: [place]/AT OM moist  Eyes: Anicteric  Neck: Supple, trachea midline  Lungs:  Scattered rhonchi, normal effort  Heart: S1S2 no rubs  Abdomen:  Soft, nontender, bowel sounds present  Extremities: Bilateral lower extremity edema noted  Neurologic: Awake, alert, follows commands  Skin: No rashes noted, b/l LE's wrapped  Access:  Right internal jugular PermCath     Basic Metabolic Panel:  Recent Labs Lab 11/01/16 0626 11/02/16 0512 11/03/16 0730 11/06/16 0627  NA 129* 130* 127* 129*  K 4.6 4.0 4.8 5.0  CL 93* 95* 92* 94*  CO2 23 24 25 24   GLUCOSE 123* 100* 125* 131*  BUN 70* 48* 63* 73*  CREATININE 5.12* 4.01* 4.93* 5.37*  CALCIUM 9.0 8.7* 8.7* 9.1  MG  --  2.1  --   --   PHOS 6.2* 5.3* 6.5* 6.8*    Liver Function Tests:  Recent Labs Lab 11/01/16 0626 11/02/16 0512 11/03/16 0730 11/06/16 0627  AST  --  39  --   --   ALT  --  54  --   --   ALKPHOS  --  212*  --   --   BILITOT  --  2.1*  --   --   PROT  --  6.2*  --   --   ALBUMIN 3.2* 3.2* 3.1* 3.4*   No results for input(s): LIPASE, AMYLASE in the last 168 hours.  Recent Labs Lab 11/04/16 1832 11/06/16 0627  AMMONIA 177* 150*    CBC:  Recent Labs Lab 11/03/16 0730 11/04/16 0659 11/04/16 1832 11/05/16 1920 11/06/16 0627  WBC 7.2 7.1 11.6* 6.5 10.3  NEUTROABS  --   --  9.1*  --   --   HGB 9.6* 9.3* 9.7* 8.5* 9.3*  HCT 29.6* 28.5* 29.8* 25.4* 29.0*  MCV 96.7 97.6 97.1 96.2 98.6  PLT CONSISTENT WITH PREVIOUS RESULT 30* 33* 52* 69*    Cardiac Enzymes: No results for input(s): CKTOTAL, CKMB,  CKMBINDEX, TROPONINI in the last 168 hours.  BNP: Invalid input(s): POCBNP  CBG: No results for input(s): GLUCAP in the last 168 hours.  Microbiology: No results found for this or any previous visit.  Coagulation Studies: No results for input(s): LABPROT, INR in the last 72 hours.  Urinalysis: No results for input(s): COLORURINE, LABSPEC, PHURINE, GLUCOSEU, HGBUR, BILIRUBINUR, KETONESUR, PROTEINUR, UROBILINOGEN, NITRITE, LEUKOCYTESUR in the last 72 hours.  Invalid input(s): APPERANCEUR    Imaging: Ct Head Wo Contrast  Result Date: 11/04/2016 CLINICAL DATA:  Altered mental status. EXAM: CT HEAD WITHOUT CONTRAST TECHNIQUE: Contiguous axial images were obtained from the base of the skull through the vertex without intravenous contrast. COMPARISON:  None. FINDINGS: Brain: There is a focal region of subcortical hypoattenuation in the lateral right temporal lobe suggesting edema with a possible underlying 1 cm rounded mass. There is no evidence of acute large territory infarct, acute intracranial hemorrhage, midline shift, or extra-axial fluid collection. The ventricles and sulci are normal for age. Vascular: Calcified atherosclerosis at the skullbase. No hyperdense vessel. Skull: No fracture or focal osseous lesion. Sinuses/Orbits: Small bilateral mastoid effusions. Clear paranasal sinuses. Unremarkable orbits.  Other: None. IMPRESSION: Focal edema in the right temporal lobe with possible small underlying mass. This could reflect neoplasm or infection. Brain MRI (noncontrast due to renal failure) is recommended for further evaluation. Contrast-enhanced head CT may also be helpful. Electronically Signed   By: Sebastian Ache M.D.   On: 11/04/2016 16:37   Mr Brain Wo Contrast  Result Date: 11/05/2016 CLINICAL DATA:  Right temporal lobe edema on CT. EXAM: MRI HEAD WITHOUT CONTRAST TECHNIQUE: Multiplanar, multiecho pulse sequences of the brain and surrounding structures were obtained without  intravenous contrast. COMPARISON:  Head CT 11/04/2016 FINDINGS: The study is mildly motion degraded. Brain: Corresponding to the abnormality in the lateral right temporal lobe on CT is a round 8 mm lesion demonstrating T1 and T2 hyperintensity, peripheral susceptibility artifact, and mild surrounding edema. Apparent restricted diffusion within the lesion is attributed to blood products. There are foci of chronic hemorrhage in the left cingulate gyrus, lateral left frontoparietal region, left temporo-occipital region, right periatrial white matter, and basal ganglia/ deep cerebral white matter bilaterally. A small amount of sulcal susceptibility artifact in the frontal lobes near the vertex is compatible with superficial cirrhosis related to remote subarachnoid hemorrhage. Patchy T2 hyperintensities throughout the cerebral white matter nonspecific but compatible with moderate chronic small vessel ischemic disease, advanced for age. The ventricles and sulci are normal. No acute infarct, midline shift, or extra-axial fluid collection is seen. Vascular: Major intracranial vascular flow voids are preserved. Skull and upper cervical spine: Unremarkable bone marrow signal. Sinuses/Orbits: Unremarkable orbits. Clear paranasal sinuses. Small mastoid effusions. Other: None. IMPRESSION: 1. 8 mm subacute hematoma in the right temporal lobe with mild surrounding edema. 2. Scattered foci of chronic hemorrhage elsewhere with mild superficial cirrhosis. Critical Value/emergent results were called by telephone at the time of interpretation on 11/05/2016 at 1:37 pm to Dr. Royann Shivers , who verbally acknowledged these results. Electronically Signed   By: Sebastian Ache M.D.   On: 11/05/2016 13:45     Medications:       Assessment/ Plan:  66 y.o. female with a PMHx of Left lower extremity cellulitis, cirrhosis of the liver secondary to fatty liver disease, generalized edema, acute renal failure, chronic kidney disease stage  III, super morbid obesity, obesity hypoventilation syndrome, rheumatoid  Arthritis, restless leg syndrome who [facility] of generalized edema and lower extremity cellulitis.  1. Acute renal failure/chronic kidney disease stage III. Patient was receiving sequential ultrafiltration at outside hospital, which was continued here initially. Patient transitioned to IHD.  - Patient seen during hemodialysis. Blood pressure noted to be low. Ultrafiltration held. Albumin has been ordered. We repeated blood pressure and systolic blood pressure was in the 90s upon repeat. We will need to monitor her progress very closely during dialysis.  2. Generalized edema. Previously had 30 pounds of fluid removed with sequential ultrafiltration. -  Ultrafiltration achieved thus far has been 1.3 kg. Hold off on further ultrafiltration for now. Albumin has been ordered for the dialysis session.  3.  Anemia of chronic kidney disease.  Hemoglobin 9.3 today. Continue to monitor.  4. Acute respiratory failure. Patient is to wear BiPAP at night.  5. Hyperkalemia. Potassium noted to be 5.0. Patient currently receiving dialysis.   LOS: 0 Shalawn Wynder 10/29/20182:23 PM

## 2016-11-06 NOTE — Consult Note (Signed)
[facility] NOTE   MRN : [id number]  Reason for Consult: ESRD Referring Physician: Dr. Norm Salt  History of Present Illness: Patient is a 66 y.o. female with a PMHx of Left lower extremity cellulitis, cirrhosis of the liver secondary to fatty liver disease, generalized edema, acute renal failure, chronic kidney disease stage III, super morbid obesity, who was admitted to [facility] on 10/12/2016 for ongoing treatment of lower extremity edema, refractory edema, and acute on chronic kidney disease.    She can speak, but appears to be confused.  She has 2 daughters that take care of her affairs.     Medications: Current medications: Vitamin D 5000 units daily, Zosyn 3.375 g IV every 12 hours, vancomycin 1500 mg IV as per pharmacy, Pepcid 20 mg daily, Protonix 40 mg twice a day, potassium effervescent 20 mEq daily, ferrous sulfate 325 mg twice a day, fluticasone salmeterol 1 inhalation twice a day, floor store 250 mg twice a day, ropinirole 0.25 mg daily, metoprolol 25 mg twice a day, folic acid 1 mg daily, Neurontin 600 mg daily, oxycodone 5 mgevery 6 hours when necessary, Tylenol 650 mg every 6 hours   Pt meds include: Statin :No Betablocker: Yes ASA: No Other anticoagulants/antiplatelets:   Past Medical History: Generalized edema Respiratory acidosis Anemia of chronic kidney disease Cirrhosis of the liver secondary to fatty liver disease Acute on chronic diastolic heart failure Chronic kidne obesity Restls leg syndrome Obesity hypoventilation syndrome Rheumatoid arthritis   Past Surgical History: dialysis catheteracement Social History Social History  Substance Use Topics  . Smoking status: Not on file  . Smokeless tobacco: Not on file  . Alcohol use Not on file    Family History   REVIEW OF SYSTEMS  General: [ ]  Weight loss, [ ]  Fever, [ ]  chills [x]  fluid weight gain Neurologic: [ ]  Dizziness, [ ]  Blackouts, [ ]   Seizure [ ]  Stroke, [ ]  "Mini stroke", [ ]  Slurred speech, [ ]  Temporary blindness; [ ]  weakness in arms or legs, [ ]  Hoarseness [ ]  Dysphagia Cardiac: [ ]  Chest pain/pressure, [ ]  Shortness of breath at rest [ ]  Shortness of breath with exertion, [ ]  Atrial fibrillation or irregular heartbeat  Vascular: [ ]  Pain in legs with walking, [ ]  Pain in legs at rest, [ ]  Pain in legs at night,  [ ]  Non-healing ulcer, [ ]  Blood clot in vein/DVT,   Pulmonary: [ ]  Home oxygen, [x ] Productive cough, [ ]  Coughing up blood, [ ]  Asthma,  [ ]  Wheezing [ ]  COPD [x]  shortness of breath at rest Musculoskeletal:  [ ]  Arthritis, [ ]  Low back pain, [ ]  Joint pain Hematologic: [ ]  Easy Bruising, [ ]  Anemia; [ ]  Hepatitis Gastrointestinal: [ ]  Blood in stool, [ ]  Gastroesophageal Reflux/heartburn, Urinary: [ ]  chronic Kidney disease, [x ] on HD - [ ]  MWF or [ ]  TTHS, [ ]  Burning with urination, [ ]  Difficulty urinating Skin: [ ]  Rashes, [ ]  Wounds Psychological: [ ]  Anxiety, [ ]  Depression  Physical Examination There were no vitals filed for this visit. There is no height or weight on file to calculate BMI.  General:  mobidly obese and working to breath HENT: WNL Eyes: Pupils equal Pulmonary: labored breathing , with Rales Cardiac: RRR, without  Murmurs, rubs or gallops; Abdomen: soft, NT, no masses Skin: no rashes, ulcers noted;  no Gangrene , no cellulitis; no open wounds;   Vascular Exam/Pulses:palpable radial pulses B  Musculoskeletal:  positive edema  Neurologic: arousal able  and alert at times    Significant Diagnostic Studies: CBC Lab Results  Component Value Date   WBC 10.3 11/06/2016   HGB 9.3 (L) 11/06/2016   HCT 29.0 (L) 11/06/2016   MCV 98.6 11/06/2016   PLT 69 (L) 11/06/2016    BMET    Component Value Date/Time   NA 129 (L) 11/06/2016 0627   K 5.0 11/06/2016 0627   CL 94 (L) 11/06/2016 0627   CO2 24 11/06/2016 0627   GLUCOSE 131 (H) 11/06/2016 0627   BUN 73 (H)  11/06/2016 0627   CREATININE 5.37 (H) 11/06/2016 0627   CALCIUM 9.1 11/06/2016 0627   GFRNONAA 7 (L) 11/06/2016 0627   GFRAA 9 (L) 11/06/2016 0627   CrCl cannot be calculated (Unknown ideal weight.).  COAG Lab Results  Component Value Date   INR 1.43 10/16/2016   INR 1.42 10/13/2016     Non-Invasive Vascular Imaging:   +-----------------+--------+-----+--------+ Right Cephalic  DiameterDepthFindings +-----------------+--------+-----+--------+ Shoulder      2.60 13.60     +-----------------+--------+-----+--------+ Prox upper arm   1.80 14.00     +-----------------+--------+-----+--------+ Mid upper arm   1.90 14.90     +-----------------+--------+-----+--------+ Dist upper arm   1.20 5.20      +-----------------+--------+-----+--------+ Antecubital fossa 3.40 4.70      +-----------------+--------+-----+--------+ Prox forearm    1.40 7.50      +-----------------+--------+-----+--------+ Mid forearm    1.50 2.70      +-----------------+--------+-----+--------+ Dist forearm    1.30 1.50      +-----------------+--------+-----+--------+  +-----------------+--------+-----+--------------+ Right Basilic  DiameterDepth  Findings   +-----------------+--------+-----+--------------+ Shoulder      4.30 19.60        +-----------------+--------+-----+--------------+ Prox upper arm   5.10 21.20        +-----------------+--------+-----+--------------+ Mid upper arm   3.20 20.30        +-----------------+--------+-----+--------------+ Dist upper arm   2.20 13.30        +-----------------+--------+-----+--------------+ Antecubital fossa 1.50 6.20         +-----------------+--------+-----+--------------+ Prox forearm    1.30 4.40          +-----------------+--------+-----+--------------+ Mid forearm          not visualized +-----------------+--------+-----+--------------+ Distal forearm         not visualized +-----------------+--------+-----+--------------+  +-----------------+--------+-----+---------+ Left Cephalic  DiameterDepthFindings  +-----------------+--------+-----+---------+ Shoulder      2.20 14.70      +-----------------+--------+-----+---------+ Prox upper arm   1.80 13.90      +-----------------+--------+-----+---------+ Mid upper arm   1.70 11.30branching +-----------------+--------+-----+---------+ Dist upper arm   1.10 7.90 branching +-----------------+--------+-----+---------+ Antecubital fossa 1.00 6.90       +-----------------+--------+-----+---------+ Prox forearm    1.80 2.40       +-----------------+--------+-----+---------+ Mid forearm    1.50 6.60       +-----------------+--------+-----+---------+ Dist forearm    1.30 7.50       +-----------------+--------+-----+---------+  +-----------------+--------+-----+--------------+ Left Basilic   DiameterDepth  Findings   +-----------------+--------+-----+--------------+ Shoulder            not visualized +-----------------+--------+-----+--------------+ Prox upper arm         not visualized +-----------------+--------+-----+--------------+ Mid upper arm         not visualized +-----------------+--------+-----+--------------+ Dist upper arm         not visualized +-----------------+--------+-----+--------------+ Antecubital fossa       not visualized +-----------------+--------+-----+--------------+ Prox forearm          not visualized +-----------------+--------+-----+--------------+  Mid forearm          not  visualized +-----------------+--------+-----+--------------+ Distal forearm         not visualized +-----------------+--------+-----+--------------+ Elbow             not visualized +-----------------+--------+-----+--------------+    ASSESSMENT/PLAN:  ESRD with temp HD cath currently on HD today right IJ. She is right hand dominant.  He best access vein appears to be the right basilic vein on vein mapping.   Dr. Arbie CookeyEarly will evaluate the patient and make recommendations for access.    Jane Boyer, Jane Boyer 11/06/2016 12:23 PM   I have examined the patient, reviewed and agree with above.  Currently on hemodialysis.  Unarousable and according to nurses staff has been this way most of the day but not always this way.  Remaining hypotensive. Morbid obesity and extensive bruising on both arms.  Vein maps show that she is clearly not a fistula candidate.  Would require arm graft at some point.  Not currently medically stable enough to proceed with elective AV graft access.  Will follow along with you to determine optimal timing for this.  Gretta BeganEarly, Shakeia Krus, MD 11/06/2016 3:09 PM

## 2016-11-07 LAB — TYPE AND SCREEN
ABO/RH(D): O POS
ANTIBODY SCREEN: NEGATIVE

## 2016-11-07 LAB — RENAL FUNCTION PANEL
ANION GAP: 11 (ref 5–15)
Albumin: 3.2 g/dL — ABNORMAL LOW (ref 3.5–5.0)
BUN: 43 mg/dL — ABNORMAL HIGH (ref 6–20)
CALCIUM: 9 mg/dL (ref 8.9–10.3)
CO2: 26 mmol/L (ref 22–32)
Chloride: 95 mmol/L — ABNORMAL LOW (ref 101–111)
Creatinine, Ser: 3.86 mg/dL — ABNORMAL HIGH (ref 0.44–1.00)
GFR calc non Af Amer: 11 mL/min — ABNORMAL LOW (ref >60)
GFR, EST AFRICAN AMERICAN: 13 mL/min — AB (ref >60)
Glucose, Bld: 130 mg/dL — ABNORMAL HIGH (ref 65–99)
PHOSPHORUS: 4.3 mg/dL (ref 2.5–4.6)
POTASSIUM: 4 mmol/L (ref 3.5–5.1)
SODIUM: 132 mmol/L — AB (ref 135–145)

## 2016-11-07 LAB — MAGNESIUM: Magnesium: 2 mg/dL (ref 1.7–2.4)

## 2016-11-07 LAB — AMMONIA: Ammonia: 124 umol/L — ABNORMAL HIGH (ref 9–35)

## 2016-11-07 LAB — T4, FREE: Free T4: 0.81 ng/dL (ref 0.61–1.12)

## 2016-11-07 LAB — CBC
HEMATOCRIT: 26.2 % — AB (ref 36.0–46.0)
HEMOGLOBIN: 8.4 g/dL — AB (ref 12.0–15.0)
MCH: 31.8 pg (ref 26.0–34.0)
MCHC: 32.1 g/dL (ref 30.0–36.0)
MCV: 99.2 fL (ref 78.0–100.0)
Platelets: 36 10*3/uL — ABNORMAL LOW (ref 150–400)
RBC: 2.64 MIL/uL — ABNORMAL LOW (ref 3.87–5.11)
RDW: 17.7 % — ABNORMAL HIGH (ref 11.5–15.5)
WBC: 16.6 10*3/uL — ABNORMAL HIGH (ref 4.0–10.5)

## 2016-11-07 LAB — TSH: TSH: 2.627 u[IU]/mL (ref 0.350–4.500)

## 2016-11-08 LAB — CBC
HEMATOCRIT: 24.2 % — AB (ref 36.0–46.0)
HEMOGLOBIN: 7.8 g/dL — AB (ref 12.0–15.0)
MCH: 31.6 pg (ref 26.0–34.0)
MCHC: 32.2 g/dL (ref 30.0–36.0)
MCV: 98 fL (ref 78.0–100.0)
Platelets: 35 10*3/uL — ABNORMAL LOW (ref 150–400)
RBC: 2.47 MIL/uL — ABNORMAL LOW (ref 3.87–5.11)
RDW: 17.5 % — ABNORMAL HIGH (ref 11.5–15.5)
WBC: 7.6 10*3/uL (ref 4.0–10.5)

## 2016-11-08 LAB — PREPARE PLATELET PHERESIS
Unit division: 0
Unit division: 0

## 2016-11-08 LAB — RENAL FUNCTION PANEL
ALBUMIN: 3 g/dL — AB (ref 3.5–5.0)
ANION GAP: 13 (ref 5–15)
BUN: 53 mg/dL — ABNORMAL HIGH (ref 6–20)
CHLORIDE: 97 mmol/L — AB (ref 101–111)
CO2: 22 mmol/L (ref 22–32)
Calcium: 9.1 mg/dL (ref 8.9–10.3)
Creatinine, Ser: 4.43 mg/dL — ABNORMAL HIGH (ref 0.44–1.00)
GFR calc Af Amer: 11 mL/min — ABNORMAL LOW (ref >60)
GFR calc non Af Amer: 9 mL/min — ABNORMAL LOW (ref >60)
GLUCOSE: 72 mg/dL (ref 65–99)
PHOSPHORUS: 4.7 mg/dL — AB (ref 2.5–4.6)
POTASSIUM: 5 mmol/L (ref 3.5–5.1)
Sodium: 132 mmol/L — ABNORMAL LOW (ref 135–145)

## 2016-11-08 LAB — BPAM PLATELET PHERESIS
Blood Product Expiration Date: 201810311500
Blood Product Expiration Date: 201811012359
ISSUE DATE / TIME: 201810301503
ISSUE DATE / TIME: 201810301148
UNIT TYPE AND RH: 6200
Unit Type and Rh: 5100

## 2016-11-08 LAB — AMMONIA: AMMONIA: 165 umol/L — AB (ref 9–35)

## 2016-11-08 NOTE — Progress Notes (Signed)
[facility]  ROUNDING NOTE   Subjective:  Patient seen and evaluated during hemodialysis. The patient be tolerating better today. Platelets still low at 35,000.  Objective:  Vital signs in last 24 hours:  Temperature 98.3 pulse 86 respirations 24 blood pressure 106/48  Physical Exam: General: Sitting up, no acute distress  Head: [place]/AT OM moist  Eyes: Anicteric  Neck: Supple, trachea midline  Lungs:  Scattered rhonchi, normal effort  Heart: S1S2 no rubs  Abdomen:  Soft, nontender, bowel sounds present  Extremities: Bilateral lower extremity edema noted  Neurologic: Awake, alert, conversant this am  Skin: cellultis b/l LE's noted  Access:  Right internal jugular PermCath     Basic Metabolic Panel:  Recent Labs Lab 11/02/16 0512 11/03/16 0730 11/06/16 0627 11/07/16 0659 11/08/16 0549  NA 130* 127* 129* 132* 132*  K 4.0 4.8 5.0 4.0 5.0  CL 95* 92* 94* 95* 97*  CO2 24 25 24 26 22   GLUCOSE 100* 125* 131* 130* 72  BUN 48* 63* 73* 43* 53*  CREATININE 4.01* 4.93* 5.37* 3.86* 4.43*  CALCIUM 8.7* 8.7* 9.1 9.0 9.1  MG 2.1  --   --  2.0  --   PHOS 5.3* 6.5* 6.8* 4.3 4.7*    Liver Function Tests:  Recent Labs Lab 11/02/16 0512 11/03/16 0730 11/06/16 0627 11/07/16 0659 11/08/16 0549  AST 39  --   --   --   --   ALT 54  --   --   --   --   ALKPHOS 212*  --   --   --   --   BILITOT 2.1*  --   --   --   --   PROT 6.2*  --   --   --   --   ALBUMIN 3.2* 3.1* 3.4* 3.2* 3.0*   No results for input(s): LIPASE, AMYLASE in the last 168 hours.  Recent Labs Lab 11/06/16 0627 11/07/16 0659 11/08/16 0549  AMMONIA 150* 124* 165*    CBC:  Recent Labs Lab 11/04/16 1832 11/05/16 1920 11/06/16 0627 11/07/16 0659 11/08/16 0549  WBC 11.6* 6.5 10.3 16.6* 7.6  NEUTROABS 9.1*  --   --   --   --   HGB 9.7* 8.5* 9.3* 8.4* 7.8*  HCT 29.8* 25.4* 29.0* 26.2* 24.2*  MCV 97.1 96.2 98.6 99.2 98.0  PLT 33* 52* 69* 36* 35*    Cardiac Enzymes: No results for  input(s): CKTOTAL, CKMB, CKMBINDEX, TROPONINI in the last 168 hours.  BNP: Invalid input(s): POCBNP  CBG: No results for input(s): GLUCAP in the last 168 hours.  Microbiology: No results found for this or any previous visit.  Coagulation Studies: No results for input(s): LABPROT, INR in the last 72 hours.  Urinalysis: No results for input(s): COLORURINE, LABSPEC, PHURINE, GLUCOSEU, HGBUR, BILIRUBINUR, KETONESUR, PROTEINUR, UROBILINOGEN, NITRITE, LEUKOCYTESUR in the last 72 hours.  Invalid input(s): APPERANCEUR    Imaging: No results found.   Medications:       Assessment/ Plan:  66 y.o. female with a PMHx of Left lower extremity cellulitis, cirrhosis of the liver secondary to fatty liver disease, generalized edema, acute renal failure, chronic kidney disease stage III, super morbid obesity, obesity hypoventilation syndrome, rheumatoid  Arthritis, restless leg syndrome who [facility] treatment of generalized edema and lower extremity cellulitis.  1. Acute renal failure/chronic kidney disease stage III. Patient was receiving sequential ultrafiltration at outside hospital, which was continued here initially. Patient transitioned to IHD.  -  Patient seen during dialysis today. Ultrafiltration target 3 kg. We will continue use albumin for blood pressure support during dialysis.  2. Generalized edema. Previously had 30 pounds of fluid removed with sequential ultrafiltration. -  As above UF target today is 3 kg.  3.  Anemia of chronic kidney disease.  Hemoglobin drifting down. Hemoglobin down to 7.8 today. Continue to monitor hemoglobin and transfuse for hemoglobin of 7 or less.  4. Acute respiratory failure. Continue nightly BiPAP.  5. Hyperkalemia. Potassium 5.0 this a.m. Dialysis will help to keep this regulated.  6. Hypotension. Continue use of albumin as well as midodrine.   LOS: 0 Jane Boyer 10/31/20188:43 AM

## 2016-11-09 ENCOUNTER — Other Ambulatory Visit (HOSPITAL_COMMUNITY): Payer: Medicare Other

## 2016-11-09 LAB — CBC
HEMATOCRIT: 24.9 % — AB (ref 36.0–46.0)
Hemoglobin: 7.9 g/dL — ABNORMAL LOW (ref 12.0–15.0)
MCH: 31.6 pg (ref 26.0–34.0)
MCHC: 31.7 g/dL (ref 30.0–36.0)
MCV: 99.6 fL (ref 78.0–100.0)
Platelets: 36 10*3/uL — ABNORMAL LOW (ref 150–400)
RBC: 2.5 MIL/uL — ABNORMAL LOW (ref 3.87–5.11)
RDW: 17.6 % — AB (ref 11.5–15.5)
WBC: 7.1 10*3/uL (ref 4.0–10.5)

## 2016-11-09 LAB — RENAL FUNCTION PANEL
Albumin: 3.1 g/dL — ABNORMAL LOW (ref 3.5–5.0)
Anion gap: 9 (ref 5–15)
BUN: 34 mg/dL — AB (ref 6–20)
CHLORIDE: 96 mmol/L — AB (ref 101–111)
CO2: 27 mmol/L (ref 22–32)
Calcium: 8.9 mg/dL (ref 8.9–10.3)
Creatinine, Ser: 3.41 mg/dL — ABNORMAL HIGH (ref 0.44–1.00)
GFR calc Af Amer: 15 mL/min — ABNORMAL LOW (ref >60)
GFR, EST NON AFRICAN AMERICAN: 13 mL/min — AB (ref >60)
GLUCOSE: 120 mg/dL — AB (ref 65–99)
POTASSIUM: 4.6 mmol/L (ref 3.5–5.1)
Phosphorus: 4 mg/dL (ref 2.5–4.6)
Sodium: 132 mmol/L — ABNORMAL LOW (ref 135–145)

## 2016-11-09 LAB — AMMONIA: AMMONIA: 91 umol/L — AB (ref 9–35)

## 2016-11-10 LAB — RENAL FUNCTION PANEL
ANION GAP: 11 (ref 5–15)
ANION GAP: 9 (ref 5–15)
Albumin: 3.3 g/dL — ABNORMAL LOW (ref 3.5–5.0)
Albumin: 3.3 g/dL — ABNORMAL LOW (ref 3.5–5.0)
BUN: 48 mg/dL — ABNORMAL HIGH (ref 6–20)
BUN: 48 mg/dL — ABNORMAL HIGH (ref 6–20)
CHLORIDE: 96 mmol/L — AB (ref 101–111)
CHLORIDE: 97 mmol/L — AB (ref 101–111)
CO2: 26 mmol/L (ref 22–32)
CO2: 26 mmol/L (ref 22–32)
CREATININE: 4.37 mg/dL — AB (ref 0.44–1.00)
Calcium: 9.3 mg/dL (ref 8.9–10.3)
Calcium: 9.1 mg/dL (ref 8.9–10.3)
Creatinine, Ser: 4.39 mg/dL — ABNORMAL HIGH (ref 0.44–1.00)
GFR calc non Af Amer: 10 mL/min — ABNORMAL LOW (ref >60)
GFR calc non Af Amer: 10 mL/min — ABNORMAL LOW (ref >60)
GFR, EST AFRICAN AMERICAN: 11 mL/min — AB (ref >60)
GFR, EST AFRICAN AMERICAN: 11 mL/min — AB (ref >60)
Glucose, Bld: 74 mg/dL (ref 65–99)
Glucose, Bld: 71 mg/dL (ref 65–99)
POTASSIUM: 4.6 mmol/L (ref 3.5–5.1)
Phosphorus: 4.4 mg/dL (ref 2.5–4.6)
Phosphorus: 4.4 mg/dL (ref 2.5–4.6)
Potassium: 4.8 mmol/L (ref 3.5–5.1)
Sodium: 133 mmol/L — ABNORMAL LOW (ref 135–145)
Sodium: 132 mmol/L — ABNORMAL LOW (ref 135–145)

## 2016-11-10 LAB — CBC
HCT: 26 % — ABNORMAL LOW (ref 36.0–46.0)
HEMATOCRIT: 25.7 % — AB (ref 36.0–46.0)
HEMOGLOBIN: 8.2 g/dL — AB (ref 12.0–15.0)
HEMOGLOBIN: 8 g/dL — AB (ref 12.0–15.0)
MCH: 31.9 pg (ref 26.0–34.0)
MCH: 31.3 pg (ref 26.0–34.0)
MCHC: 31.5 g/dL (ref 30.0–36.0)
MCHC: 31.1 g/dL (ref 30.0–36.0)
MCV: 101.2 fL — ABNORMAL HIGH (ref 78.0–100.0)
MCV: 100.4 fL — AB (ref 78.0–100.0)
Platelets: 31 10*3/uL — ABNORMAL LOW (ref 150–400)
Platelets: 36 10*3/uL — ABNORMAL LOW (ref 150–400)
RBC: 2.57 MIL/uL — ABNORMAL LOW (ref 3.87–5.11)
RBC: 2.56 MIL/uL — AB (ref 3.87–5.11)
RDW: 17.7 % — ABNORMAL HIGH (ref 11.5–15.5)
RDW: 17.7 % — ABNORMAL HIGH (ref 11.5–15.5)
WBC: 6.5 10*3/uL (ref 4.0–10.5)
WBC: 7.3 10*3/uL (ref 4.0–10.5)

## 2016-11-10 LAB — AMMONIA: Ammonia: 159 umol/L — ABNORMAL HIGH (ref 9–35)

## 2016-11-10 LAB — MAGNESIUM: MAGNESIUM: 2.2 mg/dL (ref 1.7–2.4)

## 2016-11-10 NOTE — Progress Notes (Signed)
[facility]  ROUNDING NOTE   Subjective:  Patient completed hemodialysis today.  She has been tolerating this well recently.   Objective:  Vital signs in last 24 hours:  Temperature 97.6 pulse 94 respirations 20 blood pressure 141/76  Physical Exam: General: Laying in bed, no acute distress  Head: [place]/AT OM moist  Eyes: Anicteric  Neck: Supple, trachea midline  Lungs:  Scattered rhonchi, normal effort  Heart: S1S2 no rubs  Abdomen:  Soft, nontender, bowel sounds present  Extremities: Bilateral lower extremity edema noted  Neurologic: Resting comfortably   Skin: cellultis b/l LE's noted  Access:  Right internal jugular PermCath     Basic Metabolic Panel:  Recent Labs Lab 11/07/16 0659 11/08/16 0549 11/09/16 0803 11/10/16 0519 11/10/16 0947  NA 132* 132* 132* 133* 132*  K 4.0 5.0 4.6 4.8 4.6  CL 95* 97* 96* 96* 97*  CO2 26 22 27 26 26   GLUCOSE 130* 72 120* 74 71  BUN 43* 53* 34* 48* 48*  CREATININE 3.86* 4.43* 3.41* 4.37* 4.39*  CALCIUM 9.0 9.1 8.9 9.3 9.1  MG 2.0  --   --  2.2  --   PHOS 4.3 4.7* 4.0 4.4 4.4    Liver Function Tests:  Recent Labs Lab 11/07/16 0659 11/08/16 0549 11/09/16 0803 11/10/16 0519 11/10/16 0947  ALBUMIN 3.2* 3.0* 3.1* 3.3* 3.3*   No results for input(s): LIPASE, AMYLASE in the last 168 hours.  Recent Labs Lab 11/08/16 0549 11/09/16 0803 11/10/16 0519  AMMONIA 165* 91* 159*    CBC:  Recent Labs Lab 11/04/16 1832  11/07/16 0659 11/08/16 0549 11/09/16 0803 11/10/16 0519 11/10/16 0947  WBC 11.6*  < > 16.6* 7.6 7.1 6.5 7.3  NEUTROABS 9.1*  --   --   --   --   --   --   HGB 9.7*  < > 8.4* 7.8* 7.9* 8.2* 8.0*  HCT 29.8*  < > 26.2* 24.2* 24.9* 26.0* 25.7*  MCV 97.1  < > 99.2 98.0 99.6 101.2* 100.4*  PLT 33*  < > 36* 35* 36* 31* 36*  < > = values in this interval not displayed.  Cardiac Enzymes: No results for input(s): CKTOTAL, CKMB, CKMBINDEX, TROPONINI in the last 168 hours.  BNP: Invalid input(s):  POCBNP  CBG: No results for input(s): GLUCAP in the last 168 hours.  Microbiology: No results found for this or any previous visit.  Coagulation Studies: No results for input(s): LABPROT, INR in the last 72 hours.  Urinalysis: No results for input(s): COLORURINE, LABSPEC, PHURINE, GLUCOSEU, HGBUR, BILIRUBINUR, KETONESUR, PROTEINUR, UROBILINOGEN, NITRITE, LEUKOCYTESUR in the last 72 hours.  Invalid input(s): APPERANCEUR    Imaging: Ct Head Wo Contrast  Result Date: 11/09/2016 CLINICAL DATA:  66 y/o  F; right temporal lobe hemorrhage. EXAM: CT HEAD WITHOUT CONTRAST TECHNIQUE: Contiguous axial images were obtained from the base of the skull through the vertex without intravenous contrast. COMPARISON:  11/05/2016 MRI of the head.  11/04/2016 CT of the head. FINDINGS: Brain: Stable size of lesion in the right temporal lobe with central low attenuation peripheral dense rim (series 4, image 14). Stable surrounding edema. No new hemorrhage, acute infarct, focal mass effect, extra-axial collection, or effacement of basilar cisterns. Vascular: Calcific atherosclerosis of carotid siphons. Skull: Normal. Negative for fracture or focal lesion. Sinuses/Orbits: Trace right mastoid effusion. Mild frontal sinus mucosal thickening. Otherwise negative. Other: None. IMPRESSION: 1. Stable right temporal lobe lesion characterized as subacute hematoma on prior MRI of head. Stable surrounding edema  without significant mass effect. 2. No acute intracranial abnormality identified. Electronically Signed   By: Mitzi Hansen M.D.   On: 11/09/2016 20:15     Medications:       Assessment/ Plan:  66 y.o. female with a PMHx of Left lower extremity cellulitis, cirrhosis of the liver secondary to fatty liver disease, generalized edema, acute renal failure, chronic Boyer disease stage III, super morbid obesity, obesity hypoventilation syndrome, rheumatoid  Arthritis, restless leg syndrome who was [facility] treatment of generalized edema and lower extremity cellulitis.  1. Acute renal failure/chronic Boyer disease stage III. Patient was receiving sequential ultrafiltration at outside hospital, which was continued here initially. Patient transitioned to IHD.  - We will plan for dialysis again on Monday. Orders to be prepared.  2. Generalized edema. Previously had 30 pounds of fluid removed with sequential ultrafiltration. -  Still has significant lower extremity edema. However she has been tolerating 3 kg of ultrafiltration with most dialysis treatments.  3.  Anemia of chronic Boyer disease.  Hemoglobin currently 8.0. Continue to monitor hemoglobin over the course of the hospitalization.  4. Acute respiratory failure. Continue nightly BiPAP.  5. Hyperkalemia. Potassium normalized at 4.6 today.  6. Hypotension. Continue use of albumin as well as midodrine.   LOS: 0 Legacy Carrender 11/2/20188:09 PM

## 2016-11-11 LAB — CBC
HEMATOCRIT: 25.5 % — AB (ref 36.0–46.0)
Hemoglobin: 8 g/dL — ABNORMAL LOW (ref 12.0–15.0)
MCH: 31.6 pg (ref 26.0–34.0)
MCHC: 31.4 g/dL (ref 30.0–36.0)
MCV: 100.8 fL — AB (ref 78.0–100.0)
Platelets: 51 10*3/uL — ABNORMAL LOW (ref 150–400)
RBC: 2.53 MIL/uL — ABNORMAL LOW (ref 3.87–5.11)
RDW: 17.7 % — AB (ref 11.5–15.5)
WBC: 7.4 10*3/uL (ref 4.0–10.5)

## 2016-11-11 LAB — PREPARE PLATELET PHERESIS
Unit division: 0
Unit division: 0

## 2016-11-11 LAB — BPAM PLATELET PHERESIS
BLOOD PRODUCT EXPIRATION DATE: 201811031134
Blood Product Expiration Date: 201811032359
ISSUE DATE / TIME: 201811021327
ISSUE DATE / TIME: 201811021327
UNIT TYPE AND RH: 7300
Unit Type and Rh: 6200

## 2016-11-11 LAB — HEPATITIS B SURFACE ANTIGEN: HEP B S AG: NEGATIVE

## 2016-11-11 LAB — HEPATITIS B SURFACE ANTIBODY,QUALITATIVE: Hep B S Ab: NONREACTIVE

## 2016-11-11 LAB — HEPATITIS B CORE ANTIBODY, TOTAL: Hep B Core Total Ab: NEGATIVE

## 2016-11-13 LAB — RENAL FUNCTION PANEL
ALBUMIN: 3.1 g/dL — AB (ref 3.5–5.0)
ALBUMIN: 3.3 g/dL — AB (ref 3.5–5.0)
ANION GAP: 9 (ref 5–15)
Anion gap: 8 (ref 5–15)
BUN: 55 mg/dL — ABNORMAL HIGH (ref 6–20)
BUN: 26 mg/dL — AB (ref 6–20)
CALCIUM: 9.3 mg/dL (ref 8.9–10.3)
CHLORIDE: 102 mmol/L (ref 101–111)
CO2: 26 mmol/L (ref 22–32)
CO2: 27 mmol/L (ref 22–32)
CREATININE: 4.97 mg/dL — AB (ref 0.44–1.00)
CREATININE: 2.59 mg/dL — AB (ref 0.44–1.00)
Calcium: 8.8 mg/dL — ABNORMAL LOW (ref 8.9–10.3)
Chloride: 97 mmol/L — ABNORMAL LOW (ref 101–111)
GFR calc Af Amer: 21 mL/min — ABNORMAL LOW (ref >60)
GFR calc non Af Amer: 8 mL/min — ABNORMAL LOW (ref >60)
GFR, EST AFRICAN AMERICAN: 10 mL/min — AB (ref >60)
GFR, EST NON AFRICAN AMERICAN: 18 mL/min — AB (ref >60)
GLUCOSE: 89 mg/dL (ref 65–99)
Glucose, Bld: 87 mg/dL (ref 65–99)
PHOSPHORUS: 5.1 mg/dL — AB (ref 2.5–4.6)
PHOSPHORUS: 2.6 mg/dL (ref 2.5–4.6)
POTASSIUM: 4 mmol/L (ref 3.5–5.1)
Potassium: 4.9 mmol/L (ref 3.5–5.1)
SODIUM: 132 mmol/L — AB (ref 135–145)
Sodium: 137 mmol/L (ref 135–145)

## 2016-11-13 LAB — CBC
HCT: 25.8 % — ABNORMAL LOW (ref 36.0–46.0)
HEMATOCRIT: 26.5 % — AB (ref 36.0–46.0)
HEMOGLOBIN: 8.1 g/dL — AB (ref 12.0–15.0)
Hemoglobin: 8.4 g/dL — ABNORMAL LOW (ref 12.0–15.0)
MCH: 31.4 pg (ref 26.0–34.0)
MCH: 31.6 pg (ref 26.0–34.0)
MCHC: 31.4 g/dL (ref 30.0–36.0)
MCHC: 31.7 g/dL (ref 30.0–36.0)
MCV: 100 fL (ref 78.0–100.0)
MCV: 99.6 fL (ref 78.0–100.0)
PLATELETS: 36 10*3/uL — AB (ref 150–400)
PLATELETS: 40 10*3/uL — AB (ref 150–400)
RBC: 2.58 MIL/uL — AB (ref 3.87–5.11)
RBC: 2.66 MIL/uL — ABNORMAL LOW (ref 3.87–5.11)
RDW: 17.3 % — ABNORMAL HIGH (ref 11.5–15.5)
RDW: 17.2 % — AB (ref 11.5–15.5)
WBC: 6.6 10*3/uL (ref 4.0–10.5)
WBC: 7.5 10*3/uL (ref 4.0–10.5)

## 2016-11-13 NOTE — Progress Notes (Signed)
[facility]  ROUNDING NOTE   Subjective:  Patient seen at bedside. She completed hemodialysis today. Family visiting at the bedside.   Objective:  Vital signs in last 24 hours:  Temperature 97.2 pulse 84 respiration 17 blood pressure 118/64  Physical Exam: General: Laying in bed, no acute distress  Head: [place]/AT OM moist  Eyes: Anicteric  Neck: Supple, trachea midline  Lungs:  Scattered rhonchi, normal effort  Heart: S1S2 no rubs  Abdomen:  Soft, nontender, bowel sounds present  Extremities: Bilateral lower extremity edema noted  Neurologic: Resting comfortably   Skin: cellultis b/l LE's noted  Access:  Right internal jugular PermCath     Basic Metabolic Panel: Recent Labs  Lab 11/07/16 0659  11/09/16 0803 11/10/16 0519 11/10/16 0947 11/13/16 0651 11/13/16 1004  NA 132*   < > 132* 133* 132* 132* 137  K 4.0   < > 4.6 4.8 4.6 4.9 4.0  CL 95*   < > 96* 96* 97* 97* 102  CO2 26   < > 27 26 26 26 27   GLUCOSE 130*   < > 120* 74 71 87 89  BUN 43*   < > 34* 48* 48* 55* 26*  CREATININE 3.86*   < > 3.41* 4.37* 4.39* 4.97* 2.59*  CALCIUM 9.0   < > 8.9 9.3 9.1 9.3 8.8*  MG 2.0  --   --  2.2  --   --   --   PHOS 4.3   < > 4.0 4.4 4.4 5.1* 2.6   < > = values in this interval not displayed.    Liver Function Tests: Recent Labs  Lab 11/09/16 0803 11/10/16 0519 11/10/16 0947 11/13/16 0651 11/13/16 1004  ALBUMIN 3.1* 3.3* 3.3* 3.1* 3.3*   No results for input(s): LIPASE, AMYLASE in the last 168 hours. Recent Labs  Lab 11/08/16 0549 11/09/16 0803 11/10/16 0519  AMMONIA 165* 91* 159*    CBC: Recent Labs  Lab 11/10/16 0519 11/10/16 0947 11/11/16 0841 11/13/16 0651 11/13/16 1004  WBC 6.5 7.3 7.4 6.6 7.5  HGB 8.2* 8.0* 8.0* 8.1* 8.4*  HCT 26.0* 25.7* 25.5* 25.8* 26.5*  MCV 101.2* 100.4* 100.8* 100.0 99.6  PLT 31* 36* 51* 36* 40*    Cardiac Enzymes: No results for input(s): CKTOTAL, CKMB, CKMBINDEX, TROPONINI in the last 168  hours.  BNP: Invalid input(s): POCBNP  CBG: No results for input(s): GLUCAP in the last 168 hours.  Microbiology: No results found for this or any previous visit.  Coagulation Studies: No results for input(s): LABPROT, INR in the last 72 hours.  Urinalysis: No results for input(s): COLORURINE, LABSPEC, PHURINE, GLUCOSEU, HGBUR, BILIRUBINUR, KETONESUR, PROTEINUR, UROBILINOGEN, NITRITE, LEUKOCYTESUR in the last 72 hours.  Invalid input(s): APPERANCEUR    Imaging: No results found.   Medications:       Assessment/ Plan:  66 y.o. female with a PMHx of Left lower extremity cellulitis, cirrhosis of the liver secondary to fatty liver disease, generalized edema, acute renal failure, chronic kidney disease stage III, super morbid obesity, obesity hypoventilation syndrome, rheumatoid  Arthritis, restless leg syndrome who was admitted to [facility] for ongoing treatment of generalized edema and lower extremity cellulitis.  1. Acute renal failure/chronic kidney disease stage III. Patient was receiving sequential ultrafiltration at outside hospital, which was continued here initially. Patient transitioned to IHD.  -Continue dialysis on Monday, Wednesday, Friday schedule.  Next dialysis on Wednesday.  Ultrafiltration target 3 kg.  2. Generalized edema. Previously had 30 pounds of fluid removed  with sequential ultrafiltration. -Albumin appears to be stable at 3.3.  Continue ultrafiltration with dialysis.  3.  Anemia of chronic kidney disease.  Hemoglobin slowly rising.  Currently 8.4.  Continue to monitor.  4. Acute respiratory failure. Continue nightly BiPAP.  5. Hyperkalemia.  Potassium normal at 4.0..  6. Hypotension.  Continue midodrine as well as albumin with dialysis to help support blood pressure.   LOS: 0 Kino Dunsworth 11/5/20183:24 PM

## 2016-11-15 LAB — RENAL FUNCTION PANEL
Albumin: 3.1 g/dL — ABNORMAL LOW (ref 3.5–5.0)
Anion gap: 7 (ref 5–15)
BUN: 45 mg/dL — ABNORMAL HIGH (ref 6–20)
CHLORIDE: 99 mmol/L — AB (ref 101–111)
CO2: 29 mmol/L (ref 22–32)
CREATININE: 4.46 mg/dL — AB (ref 0.44–1.00)
Calcium: 9.1 mg/dL (ref 8.9–10.3)
GFR, EST AFRICAN AMERICAN: 11 mL/min — AB (ref >60)
GFR, EST NON AFRICAN AMERICAN: 9 mL/min — AB (ref >60)
Glucose, Bld: 116 mg/dL — ABNORMAL HIGH (ref 65–99)
POTASSIUM: 4.5 mmol/L (ref 3.5–5.1)
Phosphorus: 4.4 mg/dL (ref 2.5–4.6)
Sodium: 135 mmol/L (ref 135–145)

## 2016-11-15 LAB — CBC
HEMATOCRIT: 24.6 % — AB (ref 36.0–46.0)
HEMOGLOBIN: 7.9 g/dL — AB (ref 12.0–15.0)
MCH: 32 pg (ref 26.0–34.0)
MCHC: 32.1 g/dL (ref 30.0–36.0)
MCV: 99.6 fL (ref 78.0–100.0)
Platelets: 28 10*3/uL — CL (ref 150–400)
RBC: 2.47 MIL/uL — AB (ref 3.87–5.11)
RDW: 17.2 % — ABNORMAL HIGH (ref 11.5–15.5)
WBC: 4.9 10*3/uL (ref 4.0–10.5)

## 2016-11-15 NOTE — Progress Notes (Signed)
[facility]  ROUNDING NOTE   Subjective:  Patient seen at bedside. She did undergo hemodialysis. Ultrafiltration achieved was 2.7 kg.   Objective:  Vital signs in last 24 hours:  Temperature 97.4 pulse 95 respirations 10 blood pressure 104/53  Physical Exam: General: Laying in bed, no acute distress  Head: [place]/AT OM moist  Eyes: Anicteric  Neck: Supple, trachea midline  Lungs:  Scattered rhonchi, normal effort  Heart: S1S2 no rubs  Abdomen:  Soft, nontender, bowel sounds present  Extremities: Bilateral lower extremity edema noted  Neurologic: Resting comfortably   Skin: cellultis b/l LE's noted  Access:  Right internal jugular PermCath     Basic Metabolic Panel: Recent Labs  Lab 11/10/16 0519 11/10/16 0947 11/13/16 0651 11/13/16 1004 11/15/16 0540  NA 133* 132* 132* 137 135  K 4.8 4.6 4.9 4.0 4.5  CL 96* 97* 97* 102 99*  CO2 26 26 26 27 29   GLUCOSE 74 71 87 89 116*  BUN 48* 48* 55* 26* 45*  CREATININE 4.37* 4.39* 4.97* 2.59* 4.46*  CALCIUM 9.3 9.1 9.3 8.8* 9.1  MG 2.2  --   --   --   --   PHOS 4.4 4.4 5.1* 2.6 4.4    Liver Function Tests: Recent Labs  Lab 11/10/16 0519 11/10/16 0947 11/13/16 0651 11/13/16 1004 11/15/16 0540  ALBUMIN 3.3* 3.3* 3.1* 3.3* 3.1*   No results for input(s): LIPASE, AMYLASE in the last 168 hours. Recent Labs  Lab 11/09/16 0803 11/10/16 0519  AMMONIA 91* 159*    CBC: Recent Labs  Lab 11/10/16 0947 11/11/16 0841 11/13/16 0651 11/13/16 1004 11/15/16 0540  WBC 7.3 7.4 6.6 7.5 4.9  HGB 8.0* 8.0* 8.1* 8.4* 7.9*  HCT 25.7* 25.5* 25.8* 26.5* 24.6*  MCV 100.4* 100.8* 100.0 99.6 99.6  PLT 36* 51* 36* 40* 28*    Cardiac Enzymes: No results for input(s): CKTOTAL, CKMB, CKMBINDEX, TROPONINI in the last 168 hours.  BNP: Invalid input(s): POCBNP  CBG: No results for input(s): GLUCAP in the last 168 hours.  Microbiology: No results found for this or any previous visit.  Coagulation Studies: No results  for input(s): LABPROT, INR in the last 72 hours.  Urinalysis: No results for input(s): COLORURINE, LABSPEC, PHURINE, GLUCOSEU, HGBUR, BILIRUBINUR, KETONESUR, PROTEINUR, UROBILINOGEN, NITRITE, LEUKOCYTESUR in the last 72 hours.  Invalid input(s): APPERANCEUR    Imaging: No results found.   Medications:       Assessment/ Plan:  66 y.o. female with a PMHx of Left lower extremity cellulitis, cirrhosis of the liver secondary to fatty liver disease, generalized edema, acute renal failure, chronic kidney disease stage III, super morbid obesity, obesity hypoventilation syndrome, rheumatoid  Arthritis, restless leg syndrome who was admitted to select specialty for ongoing treatment of generalized edema and lower extremity cellulitis.  1. Acute renal failure/chronic kidney disease stage III. Patient was receiving sequential ultrafiltration at outside hospital, which was continued here initially. Patient transitioned to IHD.  -Patient did complete hemodialysis today. Ultrafiltration achieved was 2.7 kg. Tentatively for now we will schedule dialysis on Friday. Thereafter appears that the family will be taking the patient home with hospice.  2. Generalized edema. Previously had 30 pounds of fluid removed with sequential ultrafiltration. - Continue ultrafiltration with one additional dialysis treatment.  3.  Anemia of chronic kidney disease.  Hemoglobin down to 7.9. Platelets were also low at 28,000 and patient was given 1 unit of platelets today.  4. Acute respiratory failure. Continue nightly BiPAP.  5. Hyperkalemia. Potassium  remains normal at 4.5.  6. Hypotension.  We will administer albumin with her last dialysis treatment on Friday.  LOS: 0 Tonita Bills 11/7/20183:11 PM

## 2016-11-16 LAB — CBC
HCT: 26.6 % — ABNORMAL LOW (ref 36.0–46.0)
HEMOGLOBIN: 8.5 g/dL — AB (ref 12.0–15.0)
MCH: 32 pg (ref 26.0–34.0)
MCHC: 32 g/dL (ref 30.0–36.0)
MCV: 100 fL (ref 78.0–100.0)
PLATELETS: 32 10*3/uL — AB (ref 150–400)
RBC: 2.66 MIL/uL — AB (ref 3.87–5.11)
RDW: 17.5 % — ABNORMAL HIGH (ref 11.5–15.5)
WBC: 6.4 10*3/uL (ref 4.0–10.5)

## 2016-11-16 LAB — BPAM PLATELET PHERESIS
Blood Product Expiration Date: 201811072359
ISSUE DATE / TIME: 201811070915
UNIT TYPE AND RH: 8400

## 2016-11-16 LAB — PREPARE PLATELET PHERESIS: Unit division: 0

## 2016-11-16 LAB — AMMONIA: AMMONIA: 196 umol/L — AB (ref 9–35)

## 2016-11-17 LAB — CBC
HCT: 26.7 % — ABNORMAL LOW (ref 36.0–46.0)
HEMOGLOBIN: 8.4 g/dL — AB (ref 12.0–15.0)
MCH: 31.3 pg (ref 26.0–34.0)
MCHC: 31.5 g/dL (ref 30.0–36.0)
MCV: 99.6 fL (ref 78.0–100.0)
Platelets: 30 10*3/uL — ABNORMAL LOW (ref 150–400)
RBC: 2.68 MIL/uL — AB (ref 3.87–5.11)
RDW: 17.1 % — ABNORMAL HIGH (ref 11.5–15.5)
WBC: 6.5 10*3/uL (ref 4.0–10.5)

## 2016-11-17 LAB — RENAL FUNCTION PANEL
ALBUMIN: 3.1 g/dL — AB (ref 3.5–5.0)
ANION GAP: 10 (ref 5–15)
BUN: 45 mg/dL — AB (ref 6–20)
CHLORIDE: 97 mmol/L — AB (ref 101–111)
CO2: 27 mmol/L (ref 22–32)
Calcium: 9.4 mg/dL (ref 8.9–10.3)
Creatinine, Ser: 4.58 mg/dL — ABNORMAL HIGH (ref 0.44–1.00)
GFR, EST AFRICAN AMERICAN: 10 mL/min — AB (ref >60)
GFR, EST NON AFRICAN AMERICAN: 9 mL/min — AB (ref >60)
GLUCOSE: 127 mg/dL — AB (ref 65–99)
Phosphorus: 4.4 mg/dL (ref 2.5–4.6)
Potassium: 4.1 mmol/L (ref 3.5–5.1)
SODIUM: 134 mmol/L — AB (ref 135–145)

## 2016-11-20 ENCOUNTER — Other Ambulatory Visit (HOSPITAL_COMMUNITY): Payer: Medicare Other

## 2016-11-20 ENCOUNTER — Encounter (HOSPITAL_COMMUNITY): Payer: Self-pay | Admitting: Radiology

## 2016-11-20 HISTORY — PX: IR REMOVAL TUN CV CATH W/O FL: IMG2289

## 2016-11-20 MED ORDER — LIDOCAINE HCL (PF) 1 % IJ SOLN
INTRAMUSCULAR | Status: DC | PRN
  Administered 2016-11-20: 5 mL

## 2016-11-20 MED ORDER — CHLORHEXIDINE GLUCONATE 4 % EX LIQD
CUTANEOUS | Status: AC
  Filled 2016-11-20: qty 15

## 2016-11-20 MED ORDER — LIDOCAINE HCL 1 % IJ SOLN
INTRAMUSCULAR | Status: AC
  Filled 2016-11-20: qty 20

## 2016-11-20 NOTE — Procedures (Signed)
Successful removal of tunneled (R)IJ HD catheter. No complications.  Brayton ElKevin Alvenia Treese PA-C Interventional Radiology 11/20/2016 11:12 AM

## 2016-12-09 DEATH — deceased

## 2019-09-01 IMAGING — DX DG CHEST 1V PORT
1 series · 1 of 1 positions shown · non-contrast
Comparison: None.

CLINICAL DATA: Respiratory failure

EXAM:
PORTABLE CHEST 1 VIEW

[chest]
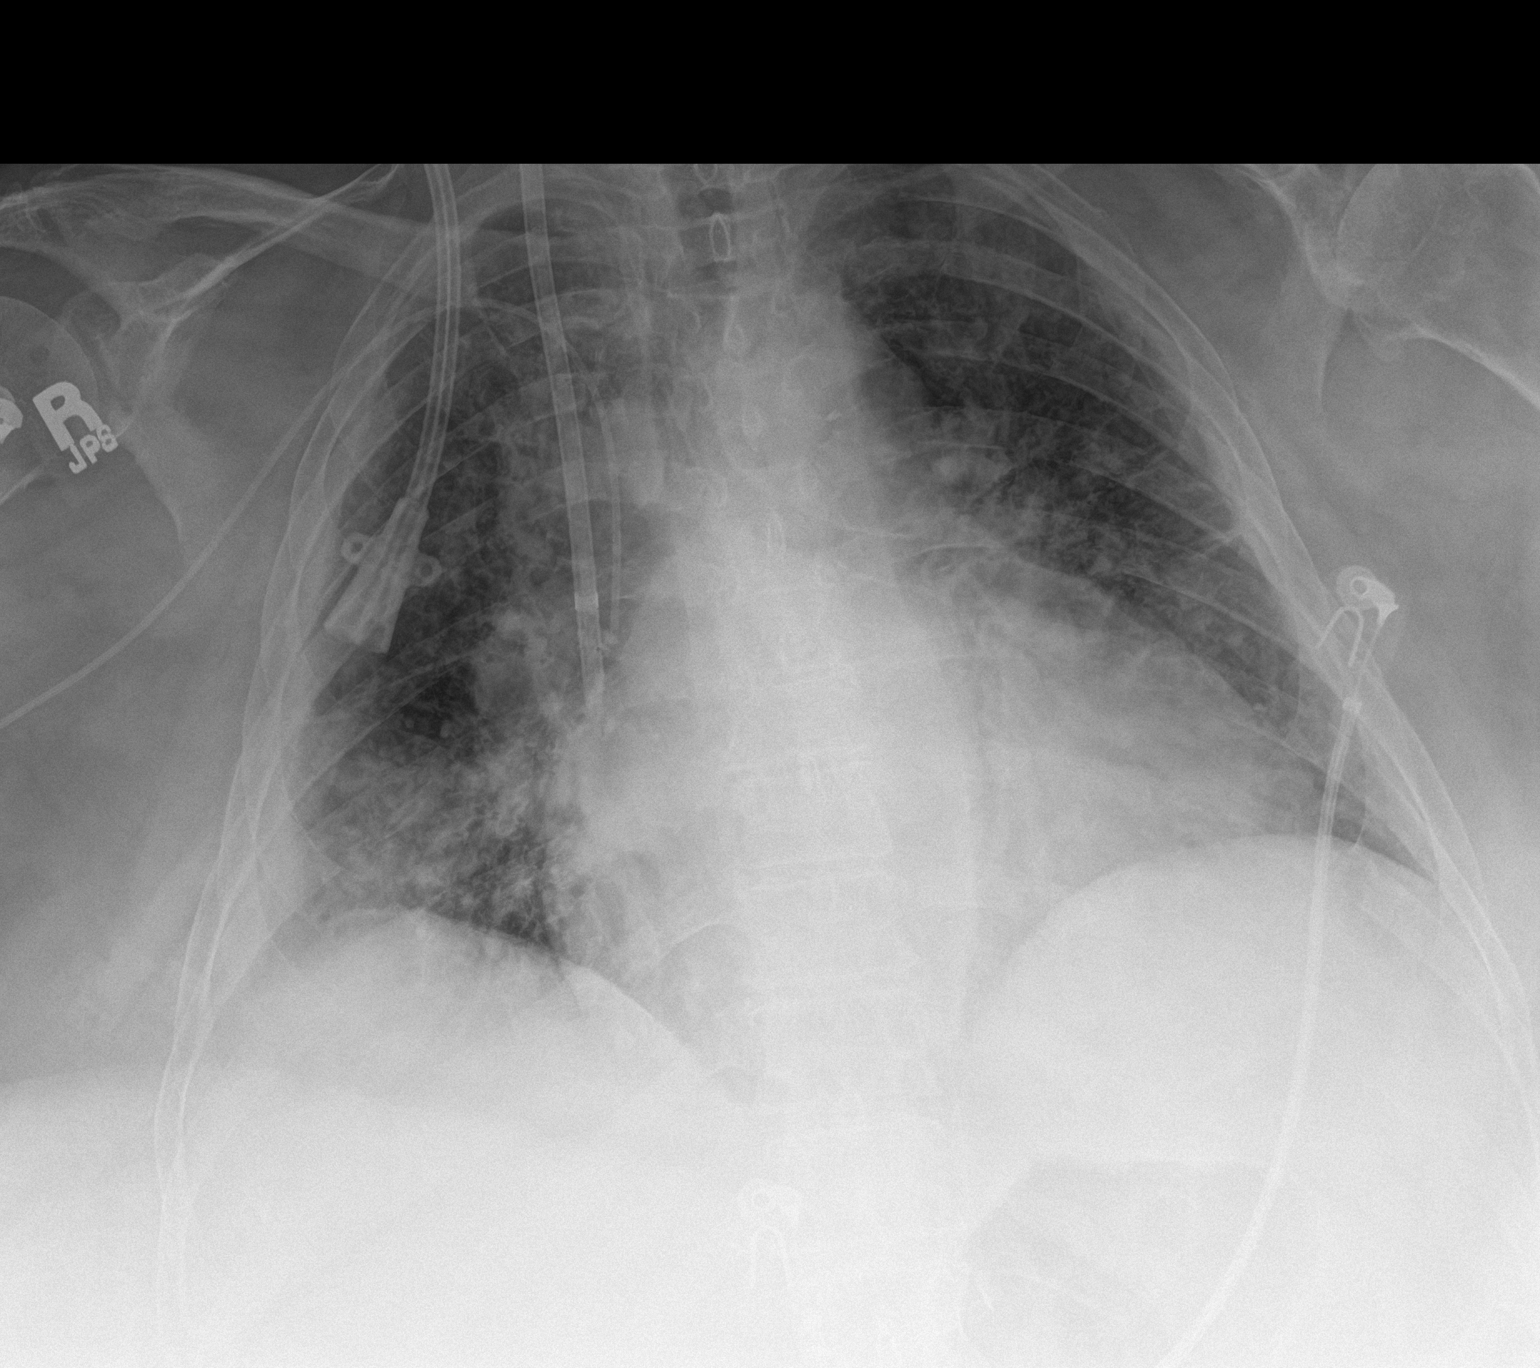

[1 of 1 positions shown; findings below may reference images not displayed]

FINDINGS: There is a PICC on the right with tip at the SVC. Dialysis catheter
on the right with tip at the upper cavoatrial junction.
Cardiomegaly. Diffuse interstitial opacity which could reflect
edema. There is asymmetric hazy opacity and pleural based thickening
on the right at the base. No pneumothorax.
IMPRESSION: 1. Cardiomegaly and edema/vascular congestion.
2. Patchy lung opacity and asymmetric right pleural based
thickening, pneumonia could be superimposed.

## 2019-09-29 IMAGING — CT CT HEAD W/O CM
3 series · 16 of 47 positions shown, 19 images · non-contrast
Comparison: 11/05/2016 MRI of the head.  11/04/2016 CT of the head.

CLINICAL DATA: 66 y/o  F; right temporal lobe hemorrhage.

EXAM:
CT HEAD WITHOUT CONTRAST
TECHNIQUE: Contiguous axial images were obtained from the base of the skull
through the vertex without intravenous contrast.

[Series 4: head 5.0 h30s · axial · 0.41mm/px · z∈[-104,+31]mm · 10 of 33 slices shown, 13 images]
[im 3/33  brain]
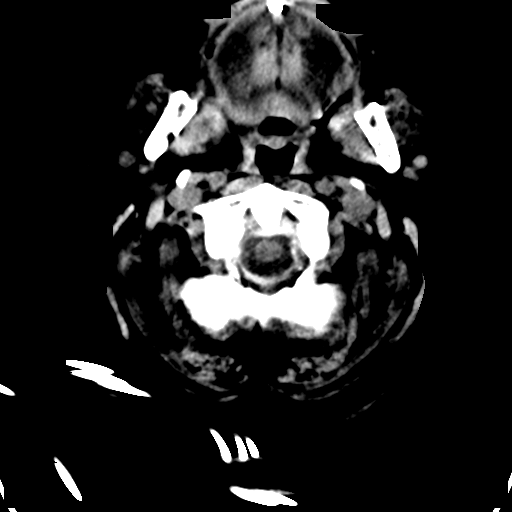
[im 3/33  bone]
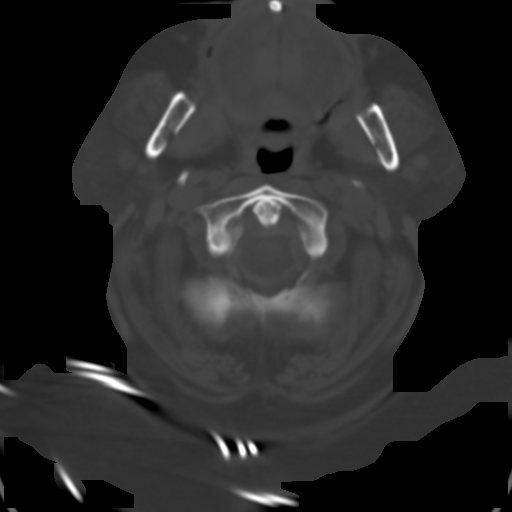
[im 6/33  brain]
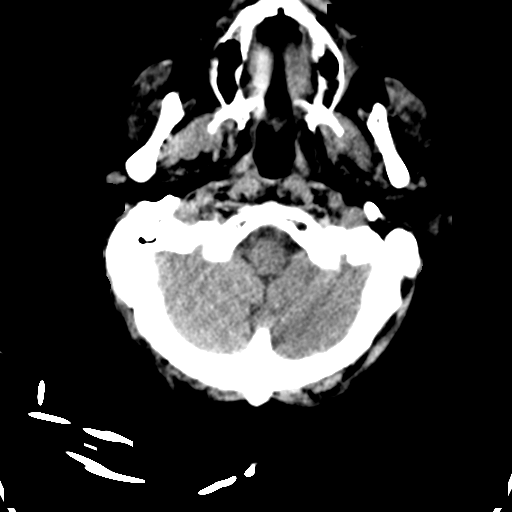
[im 9/33  brain]
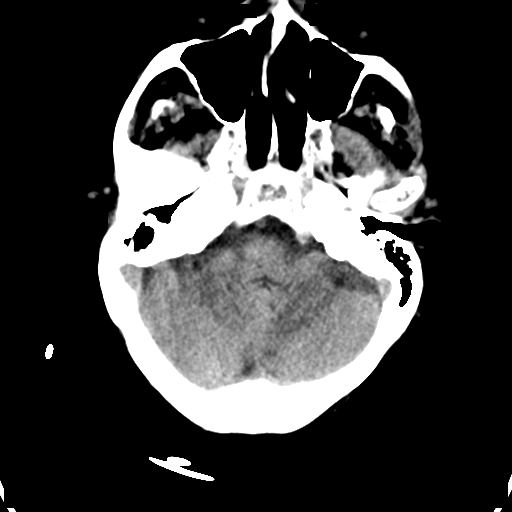
[im 12/33  brain]
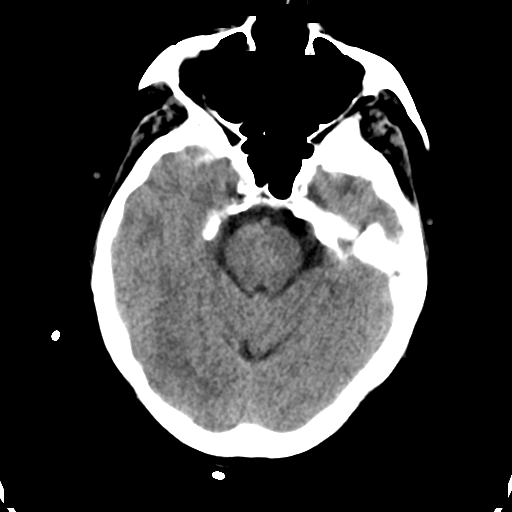
[im 15/33  brain]
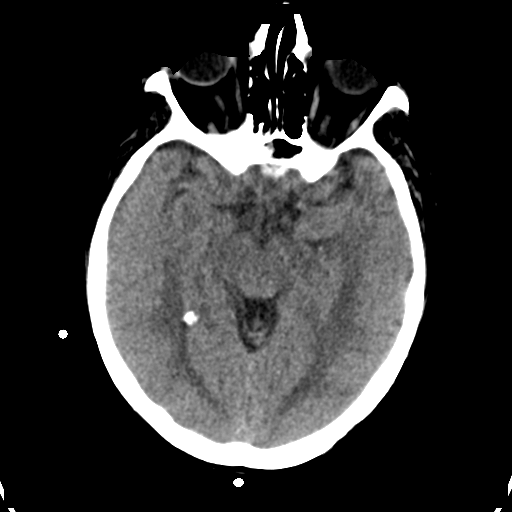
[im 15/33  bone]
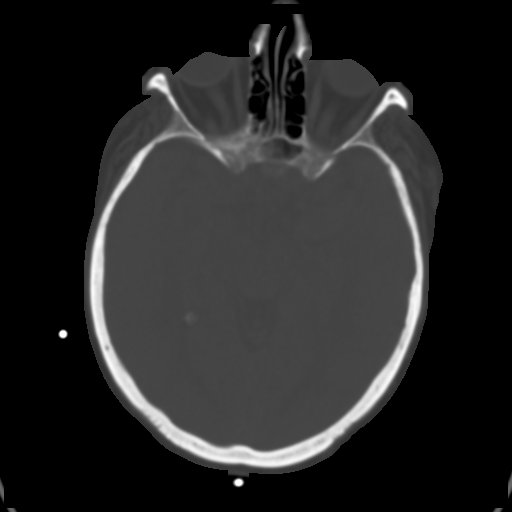
[im 18/33  brain]
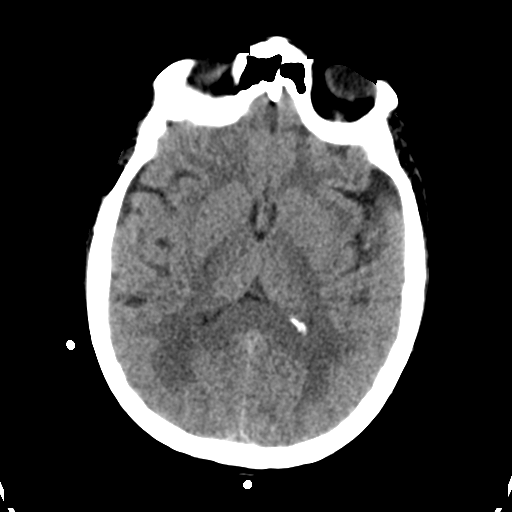
[im 21/33  brain]
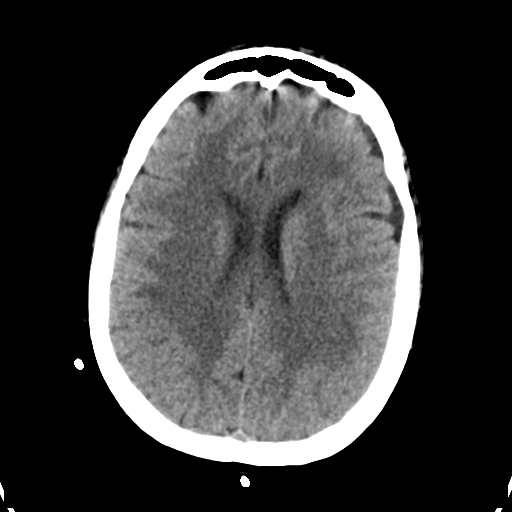
[im 25/33  brain]
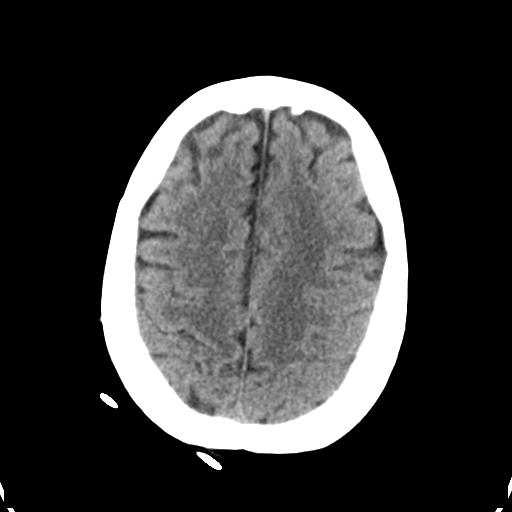
[im 27/33  brain]
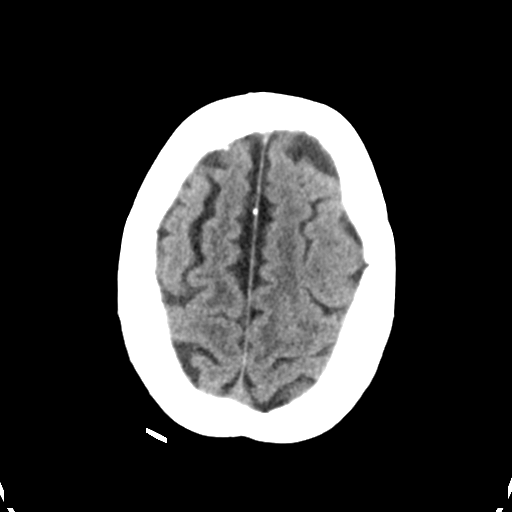
[im 27/33  bone]
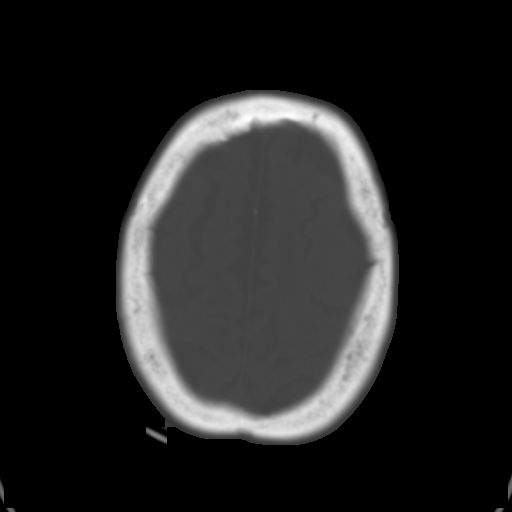
[im 30/33  brain]
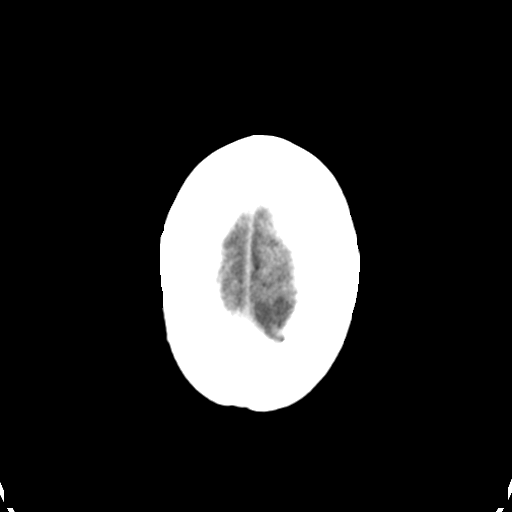

[Series 6: head 3.0 mpr cor · coronal · 0.31mm/px · 3 of 67 slices shown]
[im 23/67  brain]
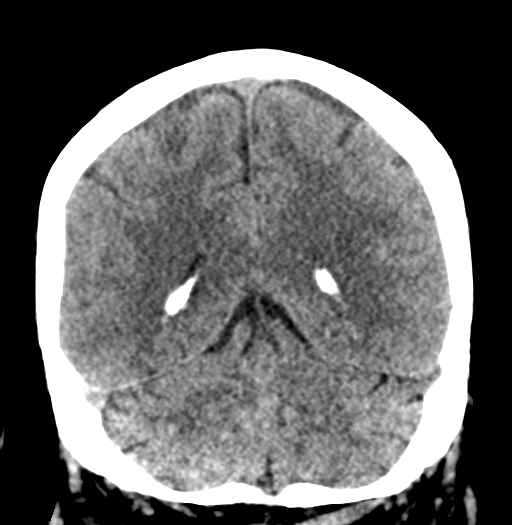
[im 30/67  brain]
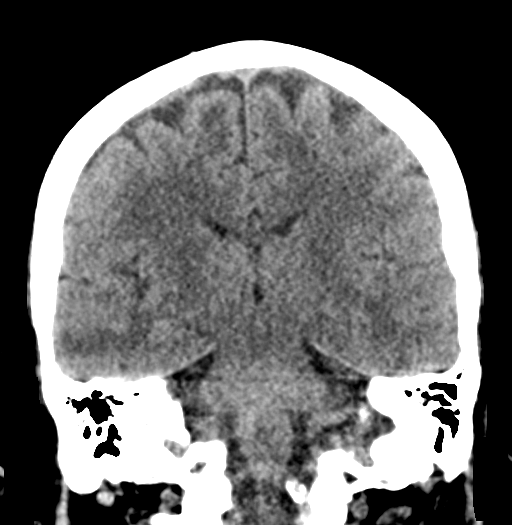
[im 37/67  brain]
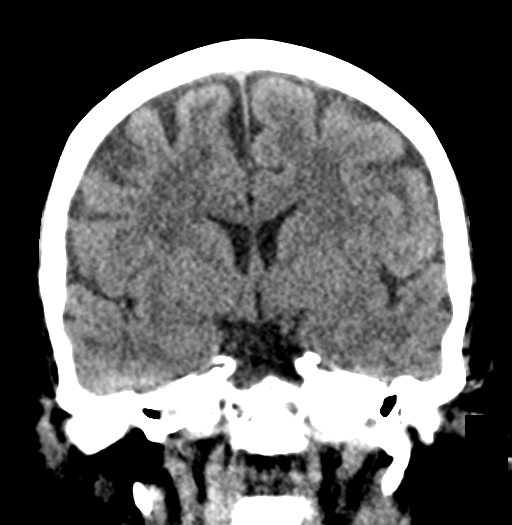

[Series 7: head 3.0 mpr sag · sagittal · 0.32mm/px · 3 of 52 slices shown]
[im 18/52  brain]
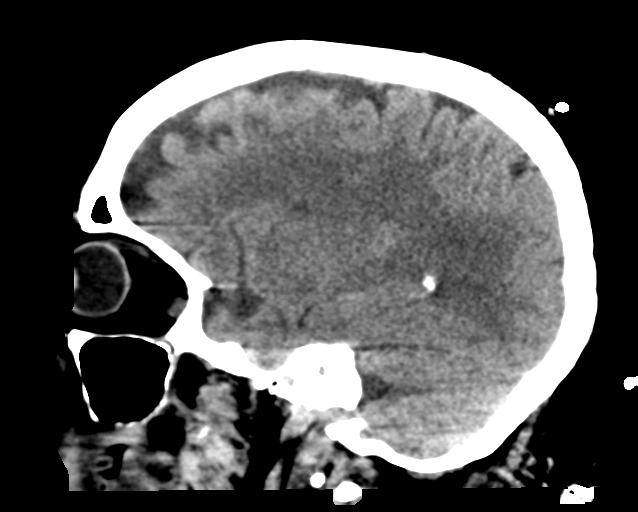
[im 26/52  brain]
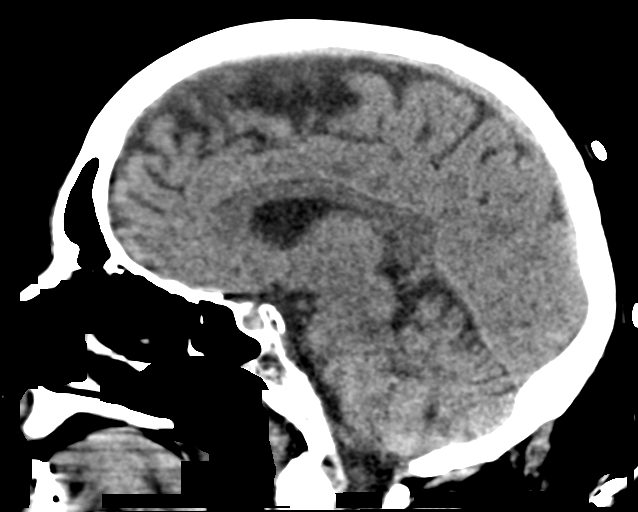
[im 35/52  brain]
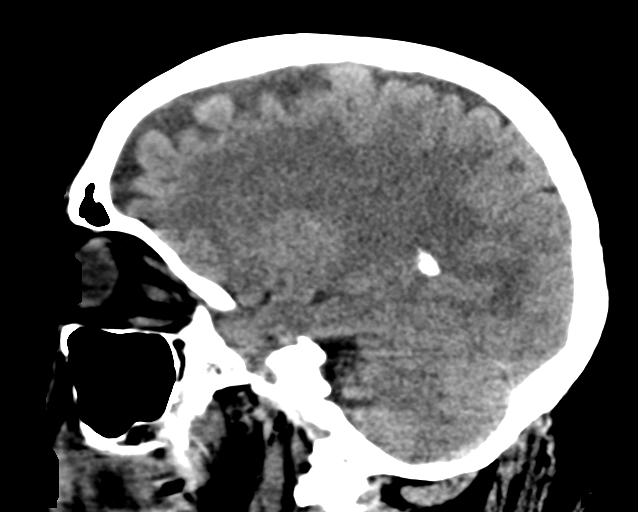

[16 of 47 positions shown; findings below may reference images not displayed]

FINDINGS: Brain: Stable size of lesion in the right temporal lobe with central
low attenuation peripheral dense rim (series 4, image 14). Stable
surrounding edema. No new hemorrhage, acute infarct, focal mass
effect, extra-axial collection, or effacement of basilar cisterns.

Vascular: Calcific atherosclerosis of carotid siphons.

Skull: Normal. Negative for fracture or focal lesion.

Sinuses/Orbits: Trace right mastoid effusion. Mild frontal sinus
mucosal thickening. Otherwise negative.

Other: None.
IMPRESSION: 1. Stable right temporal lobe lesion characterized as subacute
hematoma on prior MRI of head. Stable surrounding edema without
significant mass effect.
2. No acute intracranial abnormality identified.

By: Manouu Jury M.D.
